# Patient Record
Sex: Male | Born: 1950 | Race: White | Hispanic: No | Marital: Single
Health system: Midwestern US, Academic
[De-identification: ages and names within clinical notes are randomized; demographics above are authoritative.]

---

## 2016-12-05 ENCOUNTER — Encounter: Admit: 2016-12-05 | Discharge: 2016-12-06 | Payer: MEDICARE

## 2016-12-05 DIAGNOSIS — R69 Illness, unspecified: Principal | ICD-10-CM

## 2016-12-18 ENCOUNTER — Encounter: Admit: 2016-12-18 | Discharge: 2016-12-19 | Payer: MEDICARE

## 2016-12-18 DIAGNOSIS — R69 Illness, unspecified: Principal | ICD-10-CM

## 2017-02-20 ENCOUNTER — Encounter: Admit: 2017-02-20 | Discharge: 2017-02-21 | Payer: MEDICARE

## 2017-02-20 DIAGNOSIS — R69 Illness, unspecified: Principal | ICD-10-CM

## 2017-02-20 IMAGING — CT Abdomen^1_ABDOMEN_PELVIS_WITH (Adult)
1 series · 15 of 32 positions shown, 19 images · IV contrast (APPLIED)
Comparison: none

[Series 2: abd/pelvis with 5.0 soft tissue · axial · 0.88mm/px · z∈[-579,-129]mm · 15 of 100 slices shown, 19 images]
[im 7/100  soft-tissue]
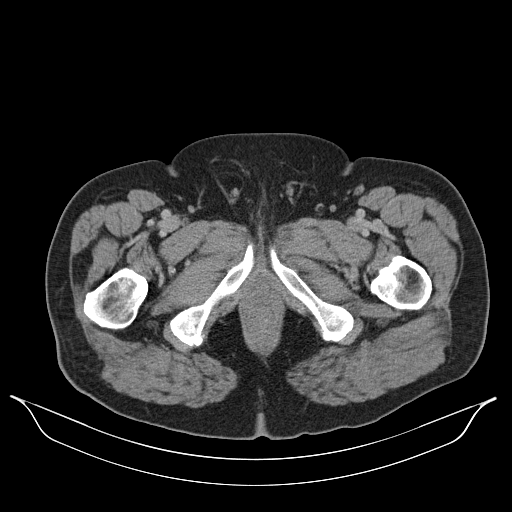
[im 7/100  bone]
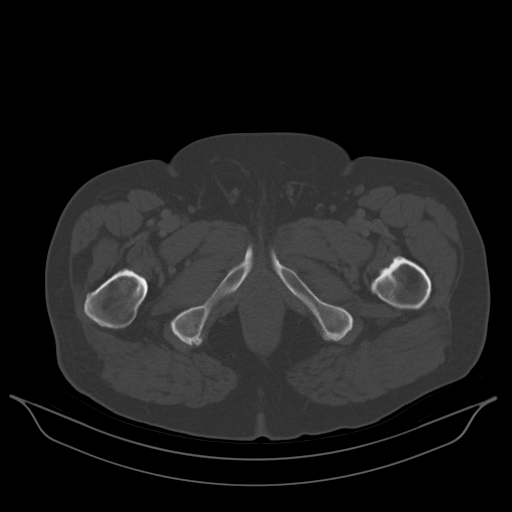
[im 13/100  soft-tissue]
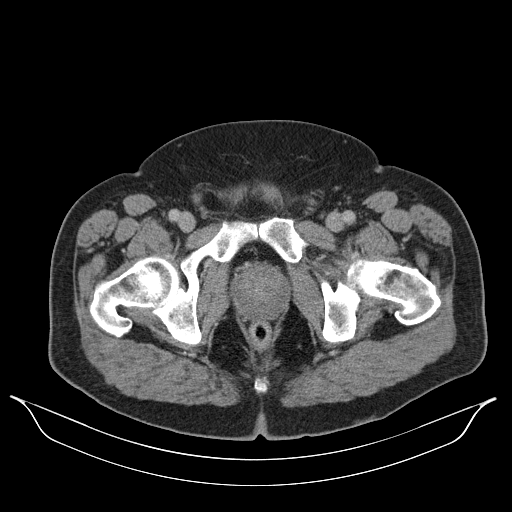
[im 20/100  soft-tissue]
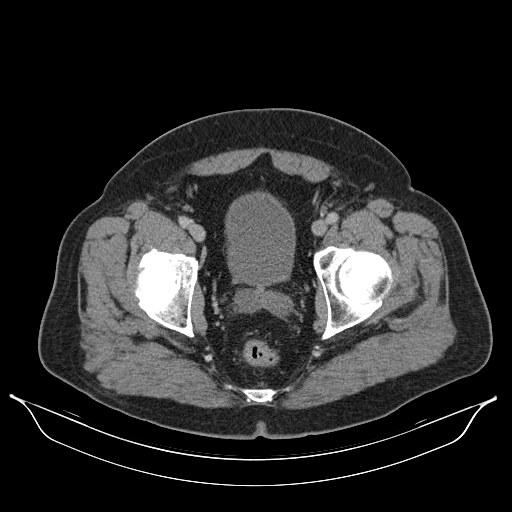
[im 29/100  soft-tissue]
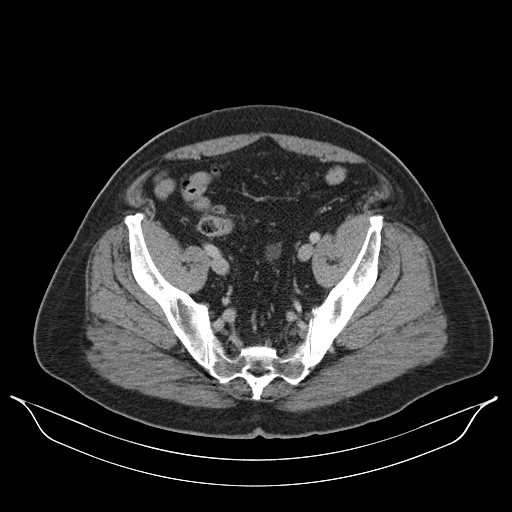
[im 36/100  soft-tissue]
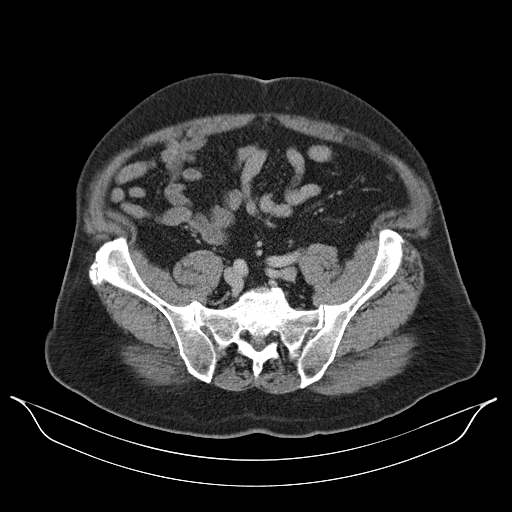
[im 42/100  soft-tissue]
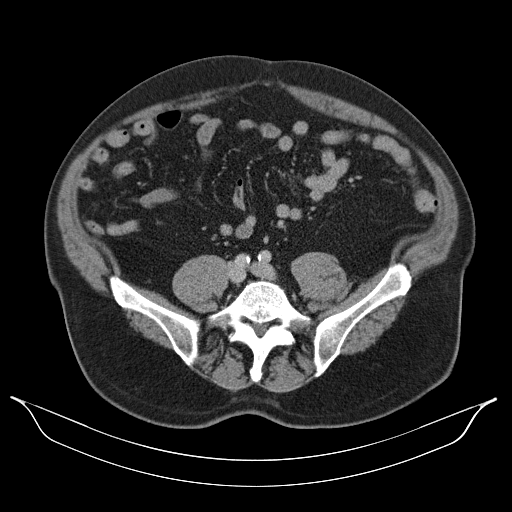
[im 52/100  soft-tissue]
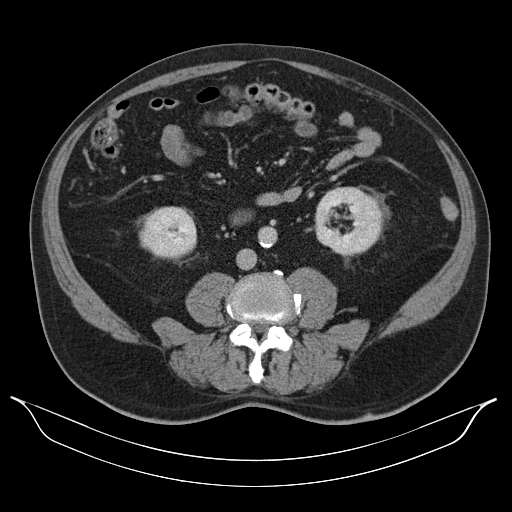
[im 58/100  soft-tissue]
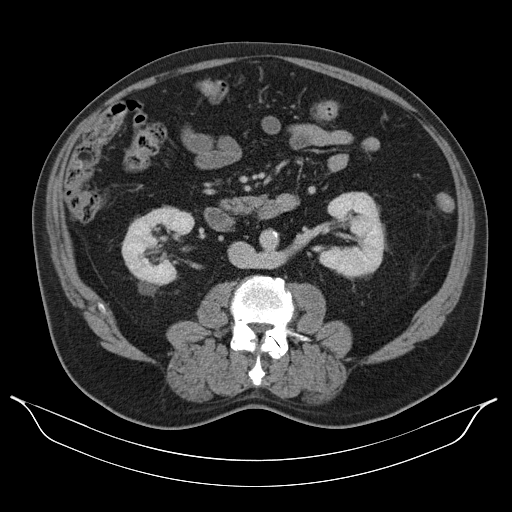
[im 64/100  soft-tissue]
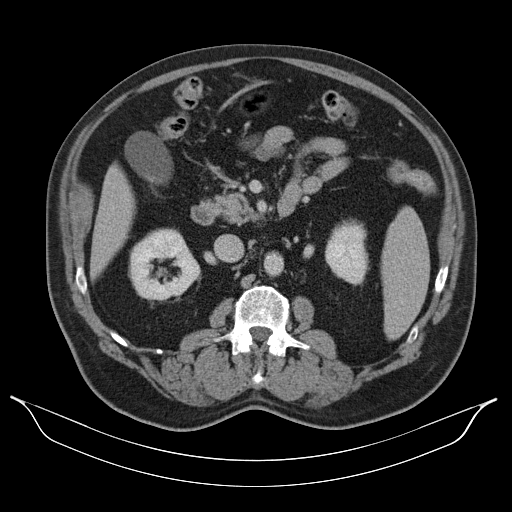
[im 64/100  bone]
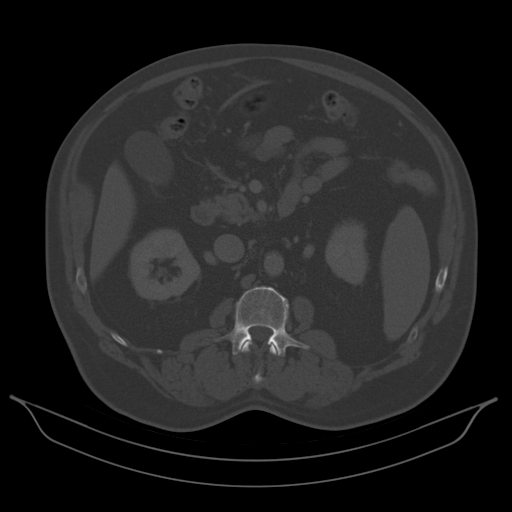
[im 71/100  soft-tissue]
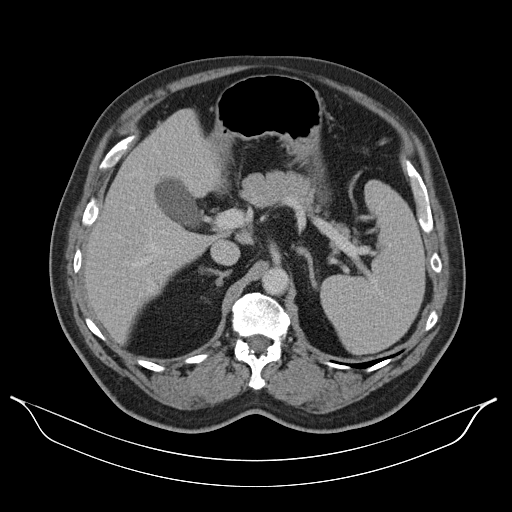
[im 80/100  soft-tissue]
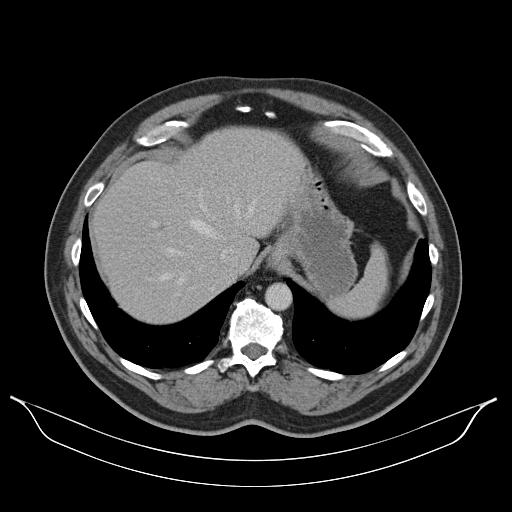
[im 87/100  soft-tissue]
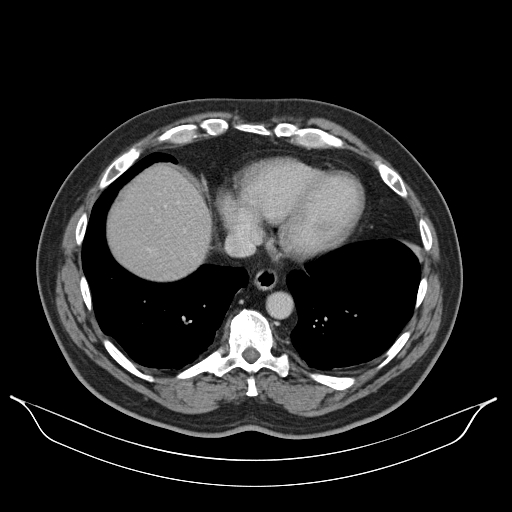
[im 87/100  lung]
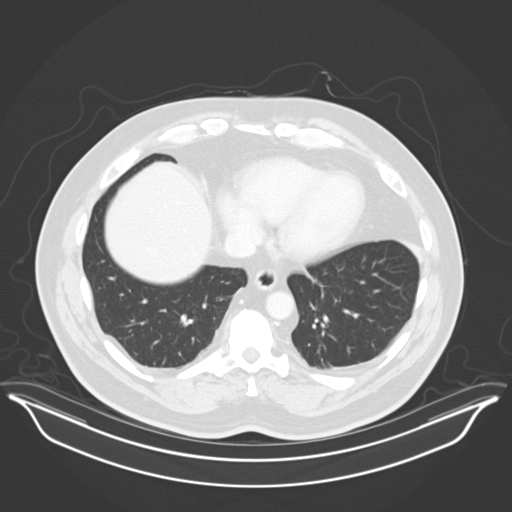
[im 90/100  lung]
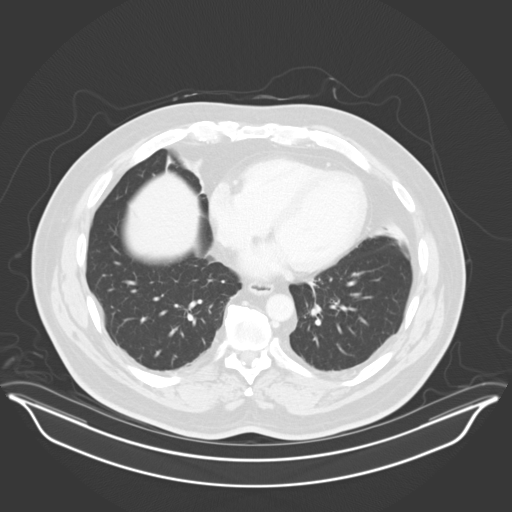
[im 93/100  soft-tissue]
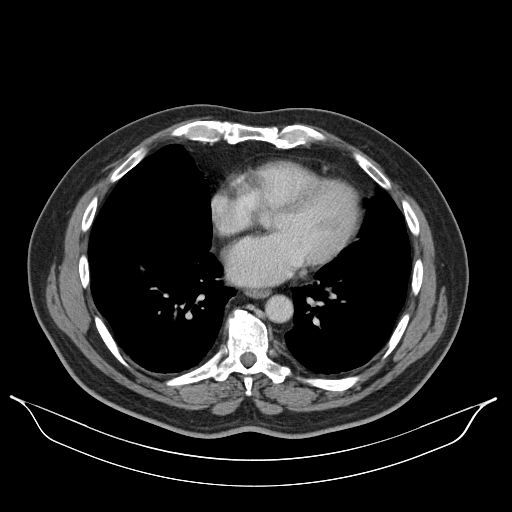
[im 93/100  lung]
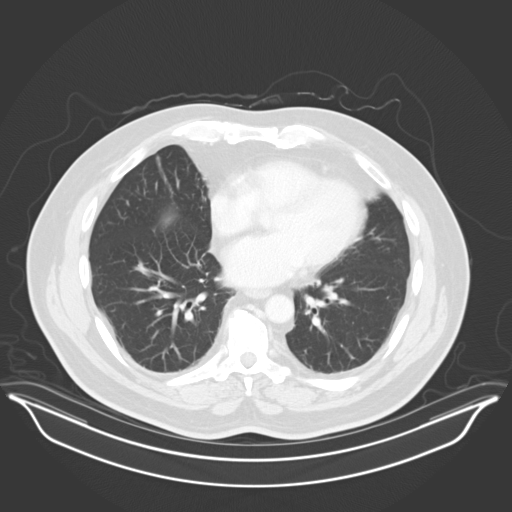
[im 96/100  lung]
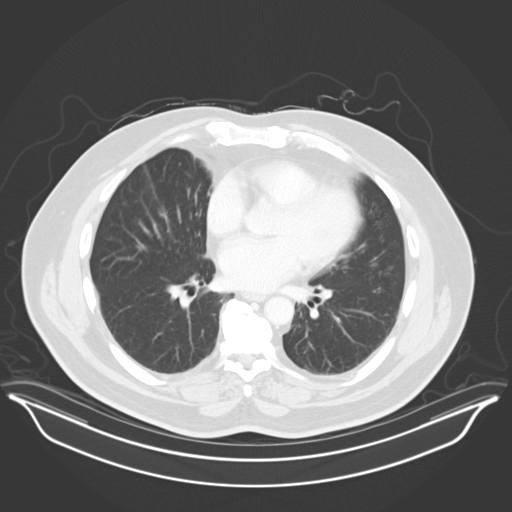

[15 of 32 positions shown; findings below may reference images not displayed]

DIAGNOSTIC STUDIES

EXAM
COMPUTED TOMOGRAPHY, ABDOMEN AND PELVIS; WITH CONTRAST MATERIAL CPT 10511

INDICATION
S/P colonoscopy X 1 d. Acute, diffuse, lower abdo pain.S/P colonoscopy X 1 d. Acute, diffuse,
lower abdo pain. Creat = 0.92 [id number]

TECHNIQUE

All CT scans at this facility use dose modulation, iterative reconstruction, and/or weight based
dosing when appropriate to reduce radiation dose to as low as reasonably achievable.

COMPARISONS
12/18/2016

FINDINGS
[The lung bases are clear. There is mild hepatic steatosis. 9 millimeter low-attenuation lesion
within the right hepatic lobe, probably a hemangioma. The spleen, pancreas, and adrenal glands
appear grossly unremarkable. Small layering gallstones within the gallbladder lumen. No
hydronephrosis. Small cyst within the lower pole of the right kidney. There is no bowel obstruction
or free air. Moderate degree of sigmoid diverticulosis. There is mild thickening of the colon at
the level of the splenic flexure and descending colon. Normal appendix. Small hiatal hernia. Mild
gastric mucosal thickening. No aortic aneurysm. Mild atherosclerotic disease. No pathologically
enlarged retroperitoneal nodes. Tiny umbilical hernia containing. The urinary bladder is
incompletely distended. Moderately enlarged prostate containing punctate calcifications. Moderate
degree of degenerative changes at the level of L4-5. Flowing anterior osteophytes within the lower
thoracic spine.

IMPRESSION
1. Moderate degree of sigmoid diverticulosis. Mild colitis at the level of the splenic flexure and
descending colon.
2. Moderately enlarged prostate.
No bowel obstruction or free air.

## 2017-02-25 ENCOUNTER — Encounter: Admit: 2017-02-25 | Discharge: 2017-02-26 | Payer: MEDICARE

## 2017-02-25 DIAGNOSIS — R69 Illness, unspecified: Principal | ICD-10-CM

## 2017-02-25 IMAGING — CT Thorax^1_CHEST_WITH (Adult)
1 series · 15 of 32 positions shown, 19 images · IV contrast (APPLIED)
Comparison: none

[Series 2: chest with 5.0 soft tissue · axial · 0.81mm/px · z∈[-388,-52]mm · 15 of 75 slices shown, 19 images]
[im 6/75  mediastinal]
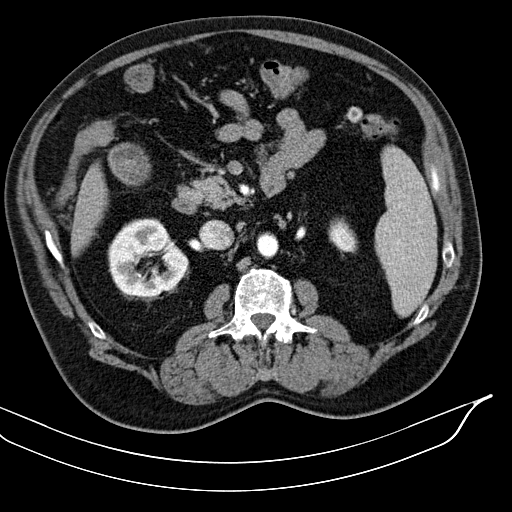
[im 6/75  lung]
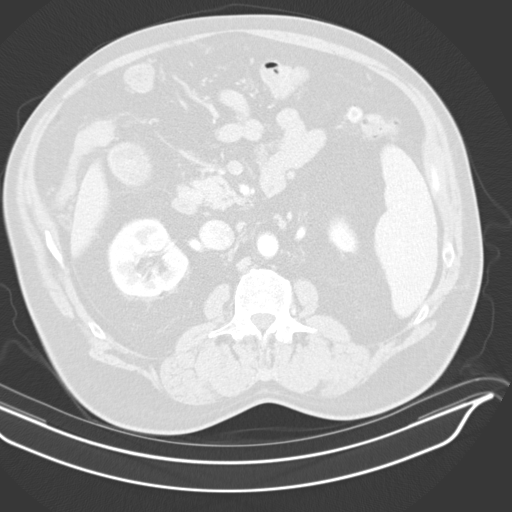
[im 11/75  lung]
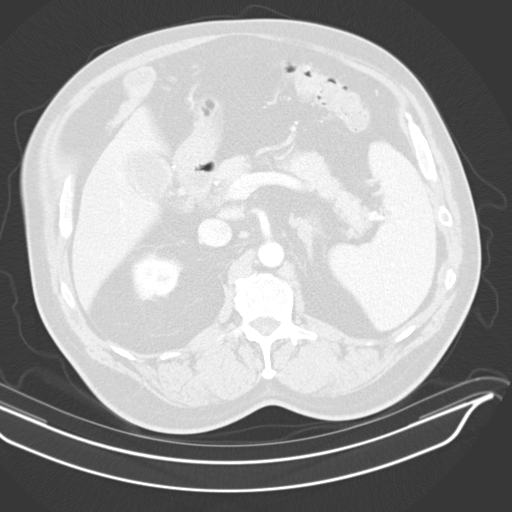
[im 15/75  lung]
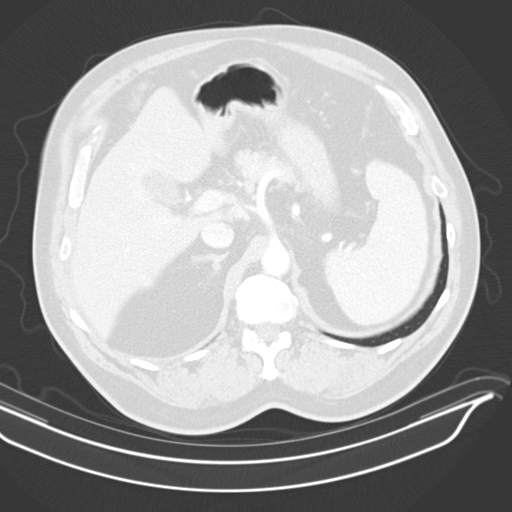
[im 20/75  lung]
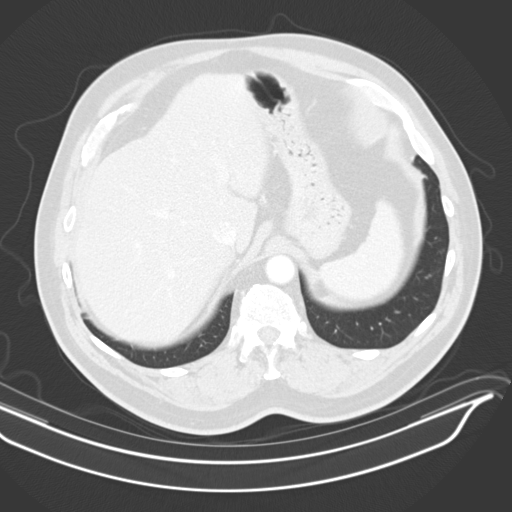
[im 25/75  mediastinal]
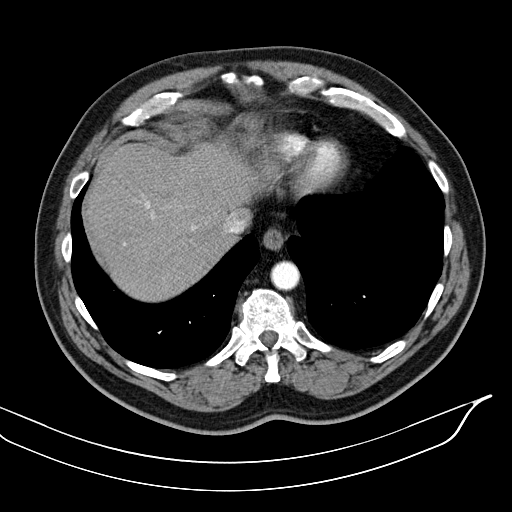
[im 25/75  lung]
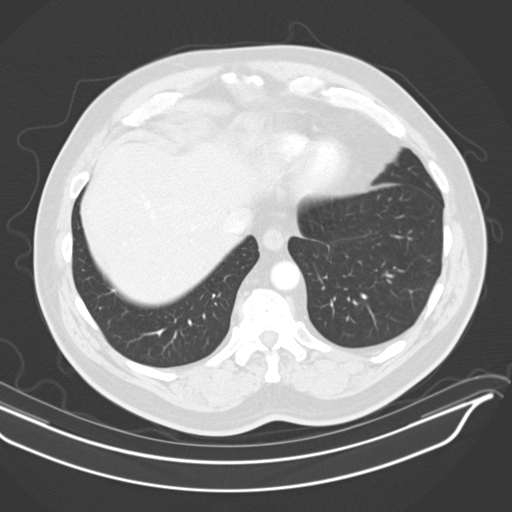
[im 30/75  lung]
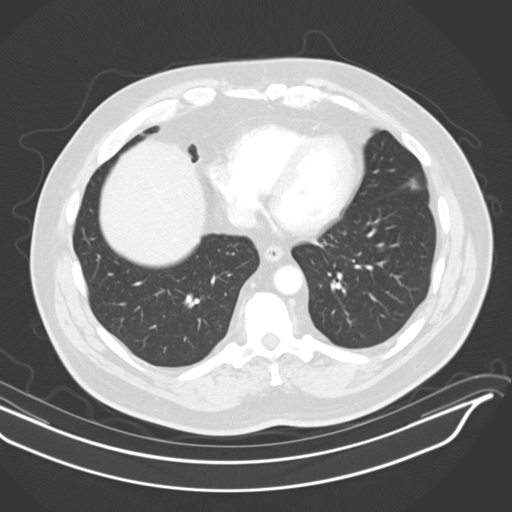
[im 33/75  lung]
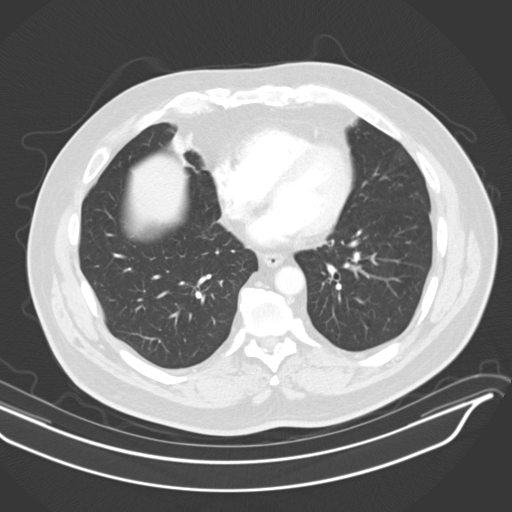
[im 39/75  lung]
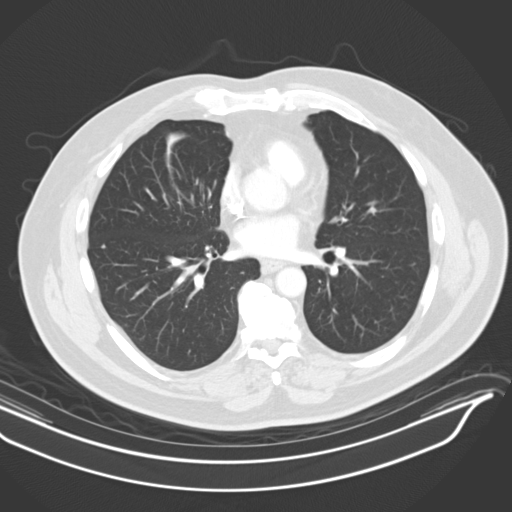
[im 44/75  mediastinal]
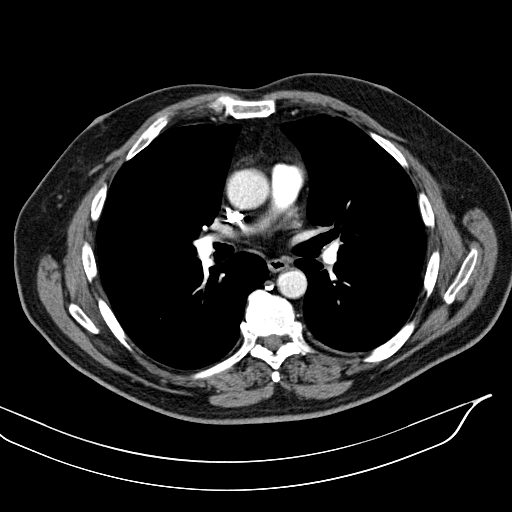
[im 44/75  lung]
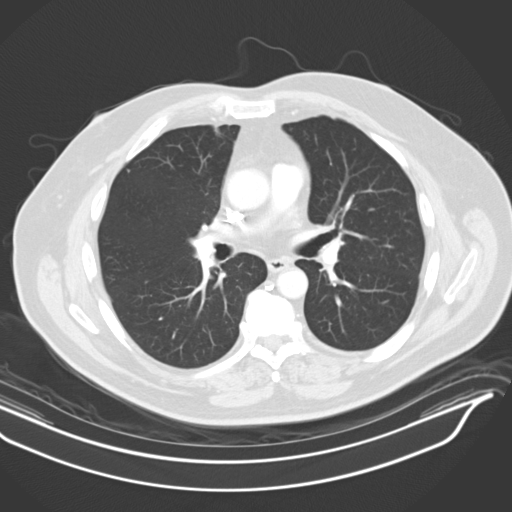
[im 47/75  lung]
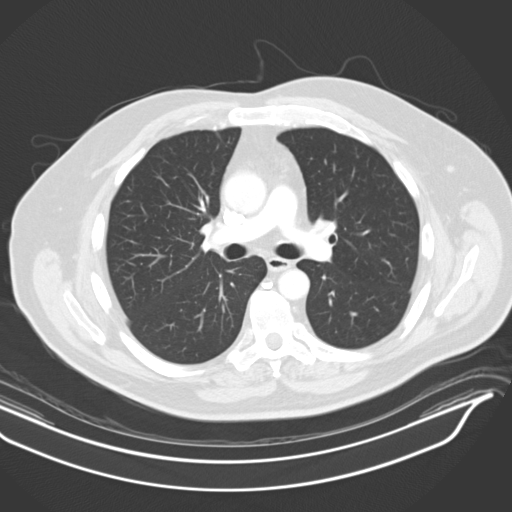
[im 53/75  lung]
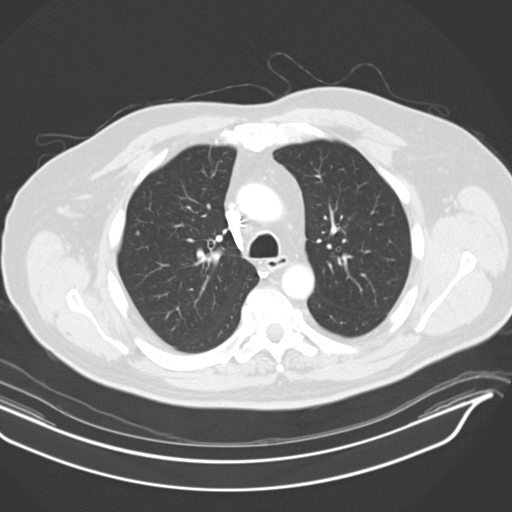
[im 58/75  lung]
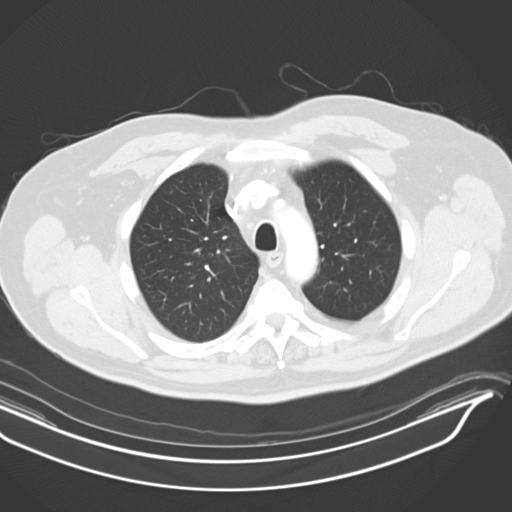
[im 61/75  mediastinal]
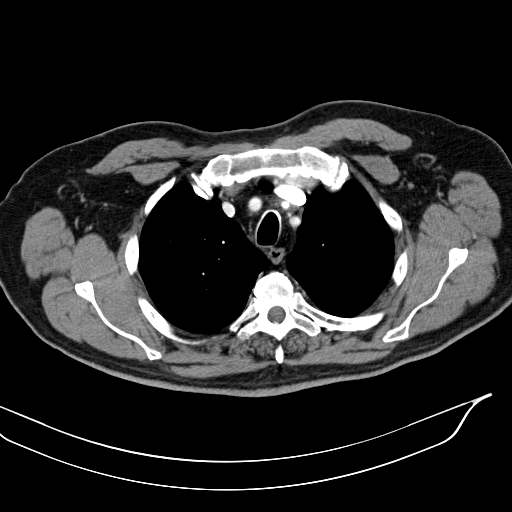
[im 61/75  lung]
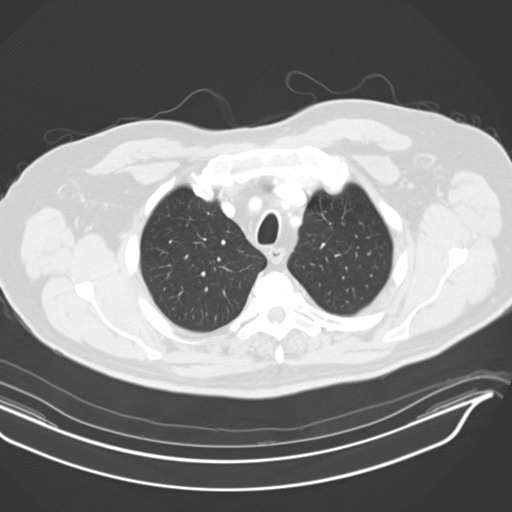
[im 66/75  lung]
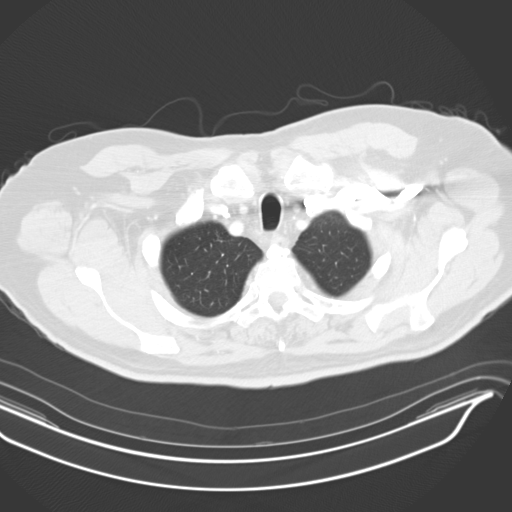
[im 72/75  lung]
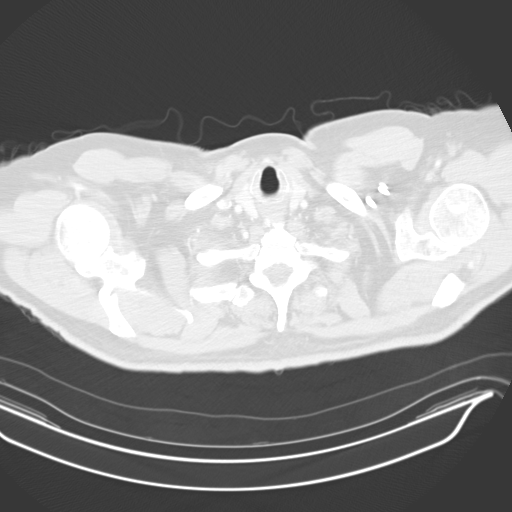

[15 of 32 positions shown; findings below may reference images not displayed]

DIAGNOSTIC STUDIES

EXAM

CT chest with contrast

INDICATION

Colon Cancer
PT STATES RECENTLY DIAGNOSED WITH COLON CANCER. HX OF HTN. GFR 90. XFFAL
[id number]. NKIA. NOT DIABETIC. TJ

TECHNIQUE

Volumetric multi detector CT images of the chest are obtained after the administration of 100 cc
Omnipaque 300 low osmolar intravenous contrast

All CT scans at this facility use dose modulation, iterative reconstruction, and/or weight based
dosing when appropriate to reduce radiation dose to as low as reasonably achievable.

COMPARISONS

None available.

FINDINGS

The thoracic inlet is grossly unremarkable. The thoracic aorta is non aneurysmal. There is no
central filling defect to suggest pulmonary embolus . Otherwise, there is no evidence of mediastinal
, hilar or axillary adenopathy. There is minimal likely atelectasis within the lingula and middle
lobes. There is noncalcified pulmonary nodule within the peripheral middle lobe on series 2, image
37 measuring 2.2 millimeters. There is additional pulmonary nodule seen within the peripheral right
upper lobe on series 2, image 23. This measures 3.3 millimeters.. There is no dominant pulmonary
mass. The thoracic vertebral body heights are grossly maintained with moderate multilevel
degenerative disc disease and diffuse flowing anterior osteophytes without evidence of definite
lytic or blastic lesion. The partially visualized upper abdominal viscera are grossly within normal
limits.

IMPRESSION

1. No evidence of acute cardiopulmonary abnormality.

2. Incidentally noted small noncalcified pulmonary nodules as described above for which further
evaluation with repeat follow-up in 6 months time is recommended.

## 2017-03-03 ENCOUNTER — Encounter: Admit: 2017-03-03 | Discharge: 2017-03-03 | Payer: MEDICARE

## 2017-03-03 DIAGNOSIS — C19 Malignant neoplasm of rectosigmoid junction: Principal | ICD-10-CM

## 2017-03-04 ENCOUNTER — Encounter: Admit: 2017-03-04 | Discharge: 2017-03-04 | Payer: MEDICARE

## 2017-03-05 ENCOUNTER — Encounter: Admit: 2017-03-05 | Discharge: 2017-03-05 | Payer: MEDICARE

## 2017-03-05 DIAGNOSIS — R69 Illness, unspecified: Principal | ICD-10-CM

## 2017-03-09 ENCOUNTER — Encounter: Admit: 2017-03-09 | Discharge: 2017-03-09 | Payer: MEDICARE

## 2017-03-09 NOTE — Telephone Encounter
Navigation Intake Assessment Document    Patient Name:  Dan Steele  DOB:  1950-10-20  Insurance: Medicare    Appointment Info:   Future Appointments  Date Time Provider Department Center   03/12/2017 12:30 PM Benetta Spar, MD CCC2 [place] Exam   03/12/2017 1:00 PM Dimas Millin [place] Exam   03/12/2017 3:00 PM PAC ROOM 2 PRE None   03/12/2017 4:30 PM MRI - HOSPITAL ROOM 1 (3T) MR Radiology     Diagnosis & Reason for Visit: Newly dx rectal cancer    Physician Info:   ??? Referring Physician:  Dr. Wilford Grist, [facility]  ??? Contact Name & Number: Nelly Laurence phone: [phone], fax [phone]  ??? PCP: Dr. Wilford Grist    Location of Films:  PACS    Location of Pathology:  Patient notified outside pathology slides will be obtained for review by [facility] pathologist and a facility and professional fee will be billed to their insurance. Requested: specimen number: [id number] collected on 02/19/17, read by [facility].    History of Present Illness: Patient reports that he presented for his first screening colonoscopy on 02/19/17. The colonoscopy wa completed by his PCP.    12/05/16 - CT A/P     12/18/16 - CT A/P - Liver lesion follow up.     02/19/17 - Colonoscopy. a 1.5 cm adenocarcinoma noted at 15 cm from the anal verge. Pathology, colonic mucosa, proximal rectal sigmoid polypectomy = invasive moderately differentiated adenocarcinoma. MSI stable. BRAF mutation not detected.     02/20/17 - CT A/P    02/25/17 - CT Chest - Small noncalcified pulmonary nodules. See full report.     02/25/17 - CEA = 1.4    03/12/17 - Consult with Dr. Daphine Deutscher planned, consult with dietitian, PAT scheduled as needed and 3T MRI pelvis also planned.     NEEDS Assessment:    Genetic Counseling:  Assessment:  Genetic Assessment: Other (Comment) (Father died of bone ca, mother has hx of breast ca. )      Intervention:  Genetic Intervention: Other (Comment) (Documented)     Nutrition: Assessment:  Current Weight (in pounds): 232  Recent Weight Loss Without Trying?: No  Eating Poorly Due to Decreased Appetite?: No  Score: Malnutrition Screening Tool (MST): 0  Additional Nutrition Assessment: Other (Comment) (Scheduled with RD, seeing surgeon. )    Intervention:  Nutrition Intervention: Referral placed to Dietician    Social & Financial:  Assessment:  Social and Financial Assessment: Reports adequate support system    Intervention:  Social and Financial Intervention: Other (Comment) (n/a)       Spiritual & Emotional:  Assessment:   Spiritual and Emotional Assessment: Reports adequate support system    Intervention:  Spiritual and Emotional Intervention: Other (Comment) (n/a)    Physical:  Assessment:  Fall Risk: None identified    Intervention:       Communication:  Assessment:  Communication Barrier: No    Intervention:  Communication intervention: Other (Comment) (n/a)    Onc Fertility:   Assessment:  Onc Fertility Assessment: Not applicable    Intervention:  Onc Fertility Intervention: Other (Comment) (n/a)    Additional Education:    Additional Education Documented: Yes

## 2017-03-12 ENCOUNTER — Encounter: Admit: 2017-03-12 | Discharge: 2017-03-12 | Payer: MEDICARE

## 2017-03-12 ENCOUNTER — Inpatient Hospital Stay: Admit: 2017-03-12 | Discharge: 2017-03-12 | Payer: MEDICARE

## 2017-03-12 ENCOUNTER — Ambulatory Visit: Admit: 2017-03-12 | Discharge: 2017-03-12 | Payer: MEDICARE

## 2017-03-12 ENCOUNTER — Ambulatory Visit: Admit: 2017-03-12 | Discharge: 2017-03-13 | Payer: MEDICARE

## 2017-03-12 DIAGNOSIS — I10 Essential (primary) hypertension: Principal | ICD-10-CM

## 2017-03-12 DIAGNOSIS — C20 Malignant neoplasm of rectum: Principal | ICD-10-CM

## 2017-03-12 DIAGNOSIS — C19 Malignant neoplasm of rectosigmoid junction: ICD-10-CM

## 2017-03-12 DIAGNOSIS — K219 Gastro-esophageal reflux disease without esophagitis: ICD-10-CM

## 2017-03-12 DIAGNOSIS — Z7982 Long term (current) use of aspirin: ICD-10-CM

## 2017-03-12 DIAGNOSIS — C801 Malignant (primary) neoplasm, unspecified: ICD-10-CM

## 2017-03-12 LAB — COMPREHENSIVE METABOLIC PANEL
Lab: 0.7 mg/dL (ref 0.4–1.24)
Lab: 1.6 mg/dL — ABNORMAL HIGH (ref 0.3–1.2)
Lab: 10 (ref 3–12)
Lab: 105 MMOL/L (ref 98–110)
Lab: 105 mg/dL — ABNORMAL HIGH (ref 70–100)
Lab: 139 MMOL/L (ref 137–147)
Lab: 17 U/L (ref 7–56)
Lab: 18 U/L (ref 7–40)
Lab: 24 MMOL/L (ref 21–30)
Lab: 3.7 MMOL/L (ref 3.5–5.1)
Lab: 4.6 g/dL (ref 3.5–5.0)
Lab: 52 U/L (ref 25–110)
Lab: 7.5 g/dL (ref 6.0–8.0)
Lab: 9 mg/dL (ref 7–25)
Lab: 9.7 mg/dL (ref 8.5–10.6)
Lab: >60 mL/min (ref >60)
Lab: >60 mL/min (ref >60)

## 2017-03-12 LAB — CBC: Lab: 8.1 10*3/uL (ref 4.5–11.0)

## 2017-03-12 NOTE — Progress Notes
Pt left prior to RD appointment, I remain available PRN.    Caprice Red, MS, RD, LD   Phone: [phone]  Pager: [phone]

## 2017-03-12 NOTE — Pre-Anesthesia Patient Instructions
GENERAL INFORMATION    Before you come to the hospital  ??? Make arrangements for a responsible adult to drive you home and stay with you for 24 hours following surgery.  ??? Bath/Shower Instructions  ??? Take a bath or shower using the special soap given to you in PAC. Use half the bottle the night before, and the other half the morning of your procedure. Use clean towels with each bath or shower.  ??? Put on clean clothes after bath or shower.  Avoid using lotion and oils.  ??? If you are having surgery above the waist, wear a shirt that fastens up the front.  ??? Sleep on clean sheets if bath or shower is done the night before procedure.  ??? Leave money, credit cards, jewelry, and any other valuables at home. [facility] is not responsible for the loss or breakage of personal items.  ??? Remove nail polish, makeup and all jewelry (including piercings) before coming to the hospital.  ??? The morning of your procedure:  ??? brush your teeth and tongue  ??? do not shave the area where you will have surgery    What to bring to the hospital  ??? ID/ Insurance Card  ??? Small bag with a few personal belongings  ??? Cases for glasses/hearing aids/contact lens (bring solutions for contacts)  ??? Dress in clean, loose, comfortable clothing     Eating or drinking before surgery  ??? Do not eat or drink anything after 11:00 p.m. the day before your procedure (including gum, mints, candy, or chewing tobacco) OR follow the specific instructions you were given by your Surgeon.  ??? You may have WATER ONLY up to 2 hours before arriving at the hospital.   ???   Other instructions  Notify your surgeon if:  ??? you become ill with a cough, fever, sore throat, nausea, vomiting or flu-like symptoms  ??? you have any open wounds/sores that are red, painful, draining, or are new since you last saw  the doctor  ??? you need to cancel your procedure  ??? You will receive a call with your surgery arrival time from between 2:30pm and 4:30pm the last business day before your procedure.  If you do not receive a call, please call [phone] before 4:30pm or [phone] after 4:30pm.    Notify us at [facility]: [phone]  ??? if you need to cancel your procedure  ??? if you are going to be late    Arrival at the hospital  [facility]  [address]  [place], [place] [address]    ? Park in the P5 parking garage located at [employer], [place], [place] [address].   ? Judee Clara parking is available in front of [employer] [facility] between the hours of 7:00 am and 4:00 pm Monday through Friday.  ? If parking in the P5 garage, take the east elevators in the parking garage to the second level and walk to the entrance of the [employer] [facility].    ? Enter through the 1st floor main entrance and check in with Information Desk.

## 2017-03-12 NOTE — Progress Notes
Name: Dan Steele          MRN: [id number]      DOB: Mar 12, 1951      AGE: 66 y.o.   DATE OF SERVICE: 03/12/2017    Subjective:             Reason for Visit:  Rectal cancer    History of Present Illness  Duilio is a 66 year old pleasant gentleman who presents to the clinic today to discuss surgical management of his newly diagnosed rectal cancer.  His past medical history is only significant for hypertension and GERD.  He underwent his first screening colonoscopy on 02/19/2017.  He reports no change in stool habits, unintentional weight loss, bloating, nausea vomiting, or blood in stool.  During the colonoscopy a lesion was identified 15 cm from the anal verge which was removed using snare polypectomy.  Pathology from that lesion revealed invasive moderately differentiated adenocarcinoma extending into the submucosa into the cauterized edge, arising out of a tubular adenoma.  He had 2 other tubular adenomas removed as well as 2 hyperplastic polyps.  The cancer specimen was negative for BRAF and was MSI negative.  He then underwent staging CT chest, abdomen, pelvis with contrast which showed no obvious metastatic lesions.  His CEA level was within normal limits at 1.4.      Even with this new diagnosis of rectal cancer he continues to report no change in his bowel habits, unintentional weight loss, bloating, nausea, vomiting, or blood in stool.  He denies any family history of colon or rectal cancer as well as irritable bowel diseases.    Review of outside records:  **Procedures:  02/19/2017 ??? Colonoscopy:   Basics/Findings (abbreviated findings): Cecum was identified with photodocumentation of the ileocecal valve.  The bowel preparation was 7/9 on the [facility] bowel prep scale.  The patient had a large 1.5 cm pedunculated polyp with central location as well as 0.8 cm immediately adjacent pedunculated polyp 15 cm from the anal verge adjacent polyp was removed using snare polypectomy with cautery.  The middle polyp was partially removed with snare polypectomy with cautery.  Posterior third was transected and removed with biopsy forceps in piecemeal fashion with at least 6-7 bites.  Patient also had 1 cm pedunculated polyp in the mid sigmoid that was removed using snare polypectomy with cautery.  The stomach was not completely removed and second snare pass was used to completely remove the stalk.  The patient also had 2 sessile 2 x 2 mm polyps in the distal sigmoid that were completely removed using biopsy forceps.    Pathology:  A. Colonic mucosa, distal sigmoid polypectomy x2: Tubular adenomas  B. Colonic mucosa, proximal rectal sigmoid polypectomy: Invasive moderately differentiated adenocarcinoma extending into the submucosa and to a cauterized edge, arising out of a tubular adenoma.  C. Colonic mucosa, distal sigmoid sessile polypectomy: Hyperplastic polyp  D. Colonic mucosa, distal sigmoid sessile polypectomy: Hyperplasic polyp   Comment: BRAF and MSI negative    **Imaging:  12/05/16 - CT Abd/pelv for nausea/vomiting/RUQ pain:  1.7cm hypodense lesion in lower right posterior segment of liver, hemangioma vs cyst, 1.1cm exophytic hypodense non-enhancing lesion in mid third of right kidney  02/20/2017 ??? CT Abd/pelv:  Moderate degree of sigmoid diverticulosis. Mild colitis at the level of the splenic flexure and descending colon. 2. Moderately enlarged prostate. No bowel obstruction or free air.  02/25/2017 ??? CT Chest:  No evidence of acute cardiopulmonary abnormality. 2. Incidentally small (<89mm) noncalcified pulmonary nodules for which  further evaluation with repeat follow up in is recommended.     **Labs:  02/25/2017 ??? CEA ??? 1.4    Past Medical History:   Diagnosis Date   ??? Acid reflux     tums prn    ??? Adenocarcinoma (HCC)     of rectum    ??? Hypertension      Past Surgical History:   Procedure Laterality Date   ??? COLONOSCOPY       Family History   Problem Relation Age of Onset   ??? Cancer-Breast Mother ??? Diabetes Mother    ??? Cancer Father    ??? Diabetes Father      Social History     Social History   ??? Marital status: Single     Spouse name: N/A   ??? Number of children: N/A   ??? Years of education: N/A     Social History Main Topics   ??? Smoking status: Former Smoker     Packs/day: 0.50     Years: 10.00     Types: Cigarettes     Quit date: 1988   ??? Smokeless tobacco: Never Used   ??? Alcohol use 8.4 oz/week     14 Cans of beer per week   ??? Drug use: No   ??? Sexual activity: Not on file     Other Topics Concern   ??? Not on file     Social History Narrative   ??? No narrative on file        Review of Systems   Constitutional: Negative for activity change, chills, fatigue, fever and unexpected weight change.   HENT: Negative for congestion, facial swelling, trouble swallowing and voice change.    Eyes: Negative for discharge, redness and itching.   Respiratory: Negative for choking, chest tightness and shortness of breath.    Cardiovascular: Negative for chest pain, palpitations and leg swelling.   Gastrointestinal: Negative for abdominal distention, abdominal pain, anal bleeding, blood in stool, constipation, diarrhea, nausea, rectal pain and vomiting.   Endocrine: Negative for cold intolerance and heat intolerance.   Genitourinary: Negative for difficulty urinating, dysuria, frequency and hematuria.   Musculoskeletal: Negative for arthralgias, gait problem and joint swelling.   Skin: Negative for color change.   Allergic/Immunologic: Negative for immunocompromised state.   Neurological: Negative for dizziness, weakness and headaches.   Psychiatric/Behavioral: Negative for agitation, behavioral problems and confusion. The patient is not nervous/anxious.    All other systems reviewed and are negative.    Objective:         ??? aspirin 81 mg chewable tablet 81 mg.   ??? hydroCHLOROthiazide (HYDRODIURIL) 12.5 mg capsule TAKE 1 CAPSULE BY MOUTH DAILY ON **MON,WED,FRI ??? HYDROcodone/acetaminophen (NORCO) 5/325 mg tablet TAKE 1 TABLET BY MOUTH EVERY 4 TO 6 HOURS AS NEEDED   ??? lisinopril (PRINIVIL, ZESTRIL) 40 mg tablet Take 40 mg by mouth daily.     Vitals:    03/12/17 1221 03/12/17 1222   BP: (!) 148/103    Pulse: 114    Resp: 18    Temp: 37.2 ???C (99 ???F)    TempSrc: Oral Oral   SpO2: 99%    Weight: 101.9 kg (224 lb 9.6 oz)    Height: 181.6 cm (71.5)      Body mass index is 30.89 kg/m???.     Pain Score: Zero     Pain Addressed:  N/A    Patient Evaluated for a Clinical Trial: No treatment clinical trial available for  this patient.     [place] Cooperative Oncology Group performance status is 0, Fully active, able to carry on all pre-disease performance without restriction.Marland Kitchen     Physical Exam   Constitutional: He is oriented to person, place, and time. He appears well-developed and well-nourished. No distress.   HENT:   Head: Normocephalic and atraumatic.   Right Ear: External ear normal.   Left Ear: External ear normal.   Nose: Nose normal.   Mouth/Throat: Oropharynx is clear and moist.   Eyes: Conjunctivae, EOM and lids are normal.   Neck: Normal range of motion.   Cardiovascular: Normal rate, regular rhythm, S1 normal, S2 normal, normal heart sounds and normal pulses.  Exam reveals no gallop.    No murmur heard.  No peripheral edema.   Pulmonary/Chest: Effort normal and breath sounds normal. No respiratory distress.   Abdominal: Soft. Bowel sounds are normal. He exhibits no distension. There is no tenderness. There is no rebound and no guarding. No hernia.   Genitourinary:   Genitourinary Comments: External - no significant external hemorrhoids or masses  DRE - no masses, no blood, good tone, slightly enlarged but smooth prostate  Rigid proctoscopy - ulcerated mass at 13cm from the anal verge on the right side   Musculoskeletal: Normal range of motion. He exhibits no edema or tenderness.   Lymphadenopathy:        Right: No supraclavicular adenopathy present. Left: No supraclavicular adenopathy present.   Neurological: He is alert and oriented to person, place, and time.   Skin: Skin is warm and dry.   Psychiatric: He has a normal mood and affect. His speech is normal and behavior is normal. Judgment and thought content normal. Cognition and memory are normal.   Vitals reviewed.    Assessment and Plan:  66 year old male with newly diagnosed MSS rectal adenocarcinoma 13cm from the anal verge without symptoms or metastatic spread.  - Outside slides ordered for review here at [facility]  - Pelvic MRI with rectal cancer protocol pending for local staging  - PAT appointment pending  - We discussed the possible treatment options today including neoadjuvant chemoradiation vs MIS low anterior resection, possible ostomy, depending upon the MRI results.  The risks and benefits were discuss in detail including risks of anastomotic leak, damage to surrounding structures (ureter, nerves, small bowel, prostate, bladder), bleeding, and infection.    - Will discuss at GI tumor board  - Will call patient after tumor board discussion    Deedra Ehrich, MD  Colon and Rectal Surgery  Pager: [phone]

## 2017-03-13 ENCOUNTER — Encounter: Admit: 2017-03-13 | Discharge: 2017-03-13 | Payer: MEDICARE

## 2017-03-13 DIAGNOSIS — K219 Gastro-esophageal reflux disease without esophagitis: ICD-10-CM

## 2017-03-13 DIAGNOSIS — C801 Malignant (primary) neoplasm, unspecified: ICD-10-CM

## 2017-03-13 DIAGNOSIS — I10 Essential (primary) hypertension: Principal | ICD-10-CM

## 2017-03-17 ENCOUNTER — Encounter: Admit: 2017-03-17 | Discharge: 2017-03-17 | Payer: MEDICARE

## 2017-03-17 NOTE — Progress Notes
[facility]  CANCER CONFERENCE    Tumor Conference Date: 03/17/17  Patient:             Dan Steele  Med Rec #:  [id number]  DOB:              [birth year]    Presenting Physician: Dr. Eulah Pont  Medical Oncologist: n/a  Surgeon: Dr. Eulah Pont  Radiation Oncologist: n/a    Attendance: Medical Oncology, Radiation Oncology, Surgical Oncology, Radiology, Pathology    Discussion:  Prospective discussion    Reason for presentation: discuss tx options and examine films    Clinical History/Significant PMHx:     History of Present Illness  Mr. Dan Steele is a 66 year old pleasant gentleman who presents to the clinic today to discuss surgical management of his newly diagnosed rectal cancer.  His past medical history is only significant for hypertension and GERD.  He underwent his first screening colonoscopy on 02/19/2017.  He reports no change in stool habits, unintentional weight loss, bloating, nausea vomiting, or blood in stool.  During the colonoscopy a lesion was identified 15 cm from the anal verge which was removed using snare polypectomy pathology from that lesion revealed invasive moderately differentiated adenocarcinoma extending into the submucosa into the cauterized edge, arising out of a tubular adenoma.  He had 2 other tubular adenomas removed as well as 2 hyperplastic polyps.  The cancer specimen was negative for BRAF and was MSI negative.  He then underwent staging CT chest, abdomen, pelvis with contrast which showed no obvious metastatic lesions.  His CEA level was within normal limits at 1.4.    ???  Even with this new diagnosis of rectal cancer he continues to report no change in his bowel habits, unintentional weight loss, bloating, nausea, vomiting, or blood in stool.  He denies any family history of colon or rectal cancer as well as irritable bowel diseases.  ???  Review of outside records:  **Procedures:  02/19/2017 ??? Colonoscopy: Basics/Findings (abbreviated findings): Cecum was identified with photodocumentation of the ileocecal valve.  The bowel preparation was 7/9 on the [facility] bowel prep scale.  The patient had a large 1.5 cm pedunculated polyp with central location as well as 0.8 cm immediately adjacent pedunculated polyp 15 cm from the anal verge adjacent polyp was removed using snare polypectomy with cautery.  The middle polyp was partially removed with snare polypectomy with cautery.  Posterior third was transected and removed with biopsy forceps in piecemeal fashion with at least 6-7 bites.  Patient also had 1 cm pedunculated polyp in the mid sigmoid that was removed using snare polypectomy with cautery.  The stomach was not completely removed and second snare pass was used to completely remove the stalk.  The patient also had 2 sessile 2 x 2 mm polyps in the distal sigmoid that were completely removed using biopsy forceps.  ???  Pathology:  A. Colonic mucosa, distal sigmoid polypectomy x2: Tubular adenomas  B. Colonic mucosa, proximal rectal sigmoid polypectomy: Invasive moderately differentiated adenocarcinoma extending into the submucosa and to a cauterized edge, arising out of a tubular adenoma.  C. Colonic mucosa, distal sigmoid sessile polypectomy: Hyperplastic polyp  D. Colonic mucosa, distal sigmoid sessile polypectomy: Hyperplasic polyp   Comment: BRAF and MSI negative  ???  **Imaging:  02/20/2017 ??? CT Abd/pelv:  Moderate degree of sigmoid diverticulosis. Mild colitis at the level of the splenic flexure and descending colon. 2. Moderately enlarged prostate. No bowel obstruction or free air.  02/25/2017 ??? CT Chest:  No evidence of acute cardiopulmonary abnormality. 2. Incidentally small noncalcified pulmonary nodules as described above for which further evaluation with repeat follow up in is recommended.   ???  **Labs:  02/25/2017 ??? CEA ??? 1.4  ???       Past Medical History:   Diagnosis Date   ??? Acid reflux ???   ??? tums prn ??? Adenocarcinoma (HCC) ???   ??? of rectum    ??? Hypertension ???   ???        Past Surgical History:   Procedure Laterality Date   ??? COLONOSCOPY ??? ???   ???        Family History   Problem Relation Age of Onset   ??? Cancer-Breast Mother ???   ??? Diabetes Mother ???   ??? Cancer Father ???   ??? Diabetes Father ???   ???           Review of Systems   Constitutional: Negative for activity change, chills, fatigue, fever and unexpected weight change.   HENT: Negative for congestion, facial swelling, trouble swallowing and voice change.    Eyes: Negative for discharge, redness and itching.   Respiratory: Negative for choking, chest tightness and shortness of breath.    Cardiovascular: Negative for chest pain, palpitations and leg swelling.   Gastrointestinal: Negative for abdominal distention, abdominal pain, anal bleeding, blood in stool, constipation, diarrhea, nausea, rectal pain and vomiting.   Endocrine: Negative for cold intolerance and heat intolerance.   Genitourinary: Negative for difficulty urinating, dysuria, frequency and hematuria.   Musculoskeletal: Negative for arthralgias, gait problem and joint swelling.   Skin: Negative for color change.   Allergic/Immunologic: Negative for immunocompromised state.   Neurological: Negative for dizziness, weakness and headaches.   Psychiatric/Behavioral: Negative for agitation, behavioral problems and confusion. The patient is not nervous/anxious.    All other systems reviewed and are negative.  ???    ???  Patient Evaluated for a Clinical Trial: No treatment clinical trial available for this patient.   ???  [place] Cooperative Oncology Group performance status is 0, Fully active, able to carry on all pre-disease performance without restriction.Marland Kitchen  ???   Physical Exam   Constitutional: He is oriented to person, place, and time. He appears well-developed and well-nourished. No distress.   HENT:   Head: Normocephalic and atraumatic.   Right Ear: External ear normal.   Left Ear: External ear normal. Nose: Nose normal.   Mouth/Throat: Oropharynx is clear and moist.   Eyes: Conjunctivae, EOM and lids are normal.   Neck: Normal range of motion.   Cardiovascular: Normal rate, regular rhythm, S1 normal, S2 normal, normal heart sounds and normal pulses.  Exam reveals no gallop.    No murmur heard.  No peripheral edema.   Pulmonary/Chest: Effort normal and breath sounds normal. No respiratory distress.   Abdominal: Soft. Bowel sounds are normal. He exhibits no distension. There is no tenderness. There is no rebound and no guarding. No hernia.   Musculoskeletal: Normal range of motion. He exhibits no edema or tenderness.   Lymphadenopathy:        Right: No supraclavicular adenopathy present.        Left: No supraclavicular adenopathy present.   Neurological: He is alert and oriented to person, place, and time.   Skin: Skin is warm and dry.   Psychiatric: He has a normal mood and affect. His speech is normal and behavior is normal. Judgment and thought content normal.  Cognition and memory are normal.   Vitals reviewed.    Assessment and Plan:  66yo male with newly diagnosed rectal adenocarcinoma 15cm from the anal verge without symptoms.  ???  -Rigid proctoscopy performed in clinic today  -Outside slides ordered for review here at [facility]  -MRI pelvis pending  -PAT appointment pending  -Will discuss at tumor board  -Will call patient after tumor board discussion  ???  Results of diagnostic procedures/radiological procedures (CT, MRI, PET, FNA):     03/12/17 - MRI Pelvis  IMPRESSION    1. ???Short segment right lateral semiannular mass in the mid to high rectum   without invasion into the mesorectal fat, consistent with the patient's   primary tumor. T2 disease.    2. ???No significant pelvic lymphadenopathy.    3. ???Mild thickening and edema of the lower rectum, which may represent   mild infectious or inflammatory proctitis.    Staging: Cancer Staging  No matching staging information was found for the patient. RECOMMENDATIONS:    MRI reviewed, staged T2N0    NCCN or other national guidelines discussed:  Yes    Candidate for open clinical trial discussed:  N/A    Staging discussed:  Yes

## 2017-03-24 ENCOUNTER — Encounter: Admit: 2017-03-24 | Discharge: 2017-03-24 | Payer: MEDICARE

## 2017-03-24 MED ORDER — ONDANSETRON 8 MG PO TBDI
8 mg | ORAL_TABLET | ORAL | 0 refills | Status: SS | PRN
Start: 2017-03-24 — End: 2017-04-08

## 2017-03-24 MED ORDER — METRONIDAZOLE 500 MG PO TAB
ORAL_TABLET | 0 refills | Status: SS
Start: 2017-03-24 — End: 2017-04-08

## 2017-03-24 MED ORDER — NEOMYCIN 500 MG PO TAB
ORAL_TABLET | 0 refills | Status: SS
Start: 2017-03-24 — End: 2017-04-08

## 2017-03-24 NOTE — Telephone Encounter
I called Dan Steele and informed him that his surgery will be scheduled 04/07/17. I will be sending his pre-op instrutions via mail. Pt informed to review this information and call our office if he has questions prior to the Friday before surgery. He understands this. His pre-op abx were sent to [facility] in [place]. No further needs.

## 2017-04-02 ENCOUNTER — Encounter: Admit: 2017-04-02 | Discharge: 2017-04-02 | Payer: MEDICARE

## 2017-04-02 NOTE — Telephone Encounter
Dan Steele called back to our office and left VM stating he is aware we desire to move his case to 12/11 instead of 12/12 as scheduled. He states kindly, "Just do it." He verbalizes in messages he does not require a call back from our office as he understands he will get a phone call on Tuesday afternoon regarding start time of the surgery and his bowel prep will need to be performed on Tuesday rather than Monday as he had planned.

## 2017-04-02 NOTE — Telephone Encounter
I called Boykin Reaper and left message stating we needed to speak with him regarding surgery date. Will await return call or try back later.

## 2017-04-08 ENCOUNTER — Encounter: Admit: 2017-04-08 | Discharge: 2017-04-08 | Payer: MEDICARE

## 2017-04-08 ENCOUNTER — Inpatient Hospital Stay: Admit: 2017-04-08 | Discharge: 2017-04-12 | Disposition: A | Discharge status: Home / Self Care | Payer: MEDICARE

## 2017-04-08 ENCOUNTER — Inpatient Hospital Stay: Admit: 2017-04-08 | Discharge: 2017-04-08 | Payer: MEDICARE

## 2017-04-08 DIAGNOSIS — K219 Gastro-esophageal reflux disease without esophagitis: ICD-10-CM

## 2017-04-08 DIAGNOSIS — C801 Malignant (primary) neoplasm, unspecified: ICD-10-CM

## 2017-04-08 DIAGNOSIS — I10 Essential (primary) hypertension: Principal | ICD-10-CM

## 2017-04-08 LAB — BLOOD GASES, ARTERIAL: Lab: 7.3 % — ABNORMAL LOW (ref 7.35–7.45)

## 2017-04-08 LAB — LACTIC ACID (BG - RAPID LACTATE): Lab: 1.3 MMOL/L — ABNORMAL LOW (ref 0.5–2.0)

## 2017-04-08 LAB — HEMOGLOBIN & HEMATOCRIT, BG: Lab: 11 g/dL — ABNORMAL LOW (ref 13.5–16.5)

## 2017-04-08 LAB — IONIZED CALCIUM,BG: Lab: 1 MMOL/L — ABNORMAL HIGH (ref 1.0–1.3)

## 2017-04-08 LAB — POTASSIUM, BG: Lab: 3.3 MMOL/L — ABNORMAL LOW (ref 3.5–5.1)

## 2017-04-08 LAB — GLUCOSE,BG: Lab: 140 mg/dL — ABNORMAL HIGH (ref 70–100)

## 2017-04-08 LAB — SODIUM,BG: Lab: 139 MMOL/L — ABNORMAL HIGH (ref 137–147)

## 2017-04-08 MED ORDER — HYDROMORPHONE 2 MG/ML IJ SOLN
0 refills | Status: DC
Start: 2017-04-08 — End: 2017-04-08
  Administered 2017-04-08 (×2): 0.5 mg via INTRAVENOUS
  Administered 2017-04-08: 17:00:00 .5 mg via INTRAVENOUS

## 2017-04-08 MED ORDER — ALBUMIN, HUMAN 5 % 500 ML IV SOLP (AN)(OSM)
0 refills | Status: DC
Start: 2017-04-08 — End: 2017-04-08
  Administered 2017-04-08: 16:00:00 via INTRAVENOUS

## 2017-04-08 MED ORDER — ELECTROLYTE-A IV SOLP
0 refills | Status: DC
Start: 2017-04-08 — End: 2017-04-08
  Administered 2017-04-08 (×2): via INTRAVENOUS

## 2017-04-08 MED ORDER — ACETAMINOPHEN 1,000 MG/100 ML (10 MG/ML) IV SOLN
0 refills | Status: DC
Start: 2017-04-08 — End: 2017-04-08
  Administered 2017-04-08: 20:00:00 1000 mg via INTRAVENOUS

## 2017-04-08 MED ORDER — DIPHENHYDRAMINE HCL 50 MG/ML IJ SOLN
25 mg | Freq: Once | INTRAVENOUS | 0 refills | Status: DC | PRN
Start: 2017-04-08 — End: 2017-04-08

## 2017-04-08 MED ORDER — DEXTRAN 70-HYPROMELLOSE (PF) 0.1-0.3 % OP DPET
0 refills | Status: DC
Start: 2017-04-08 — End: 2017-04-08
  Administered 2017-04-08: 14:00:00 2 [drp] via OPHTHALMIC

## 2017-04-08 MED ORDER — OXYCODONE 5 MG PO TAB
5-10 mg | Freq: Once | ORAL | 0 refills | Status: DC | PRN
Start: 2017-04-08 — End: 2017-04-08

## 2017-04-08 MED ORDER — LIDOCAINE-EPINEPHRINE 1 %-1:100,000 IJ SOLN
0 refills | Status: DC
Start: 2017-04-08 — End: 2017-04-08
  Administered 2017-04-08: 20:00:00 5 mL via SUBCUTANEOUS

## 2017-04-08 MED ORDER — PHENOL 1.4 % MM SPRA
2 | OROMUCOSAL | 0 refills | Status: DC | PRN
Start: 2017-04-08 — End: 2017-04-12

## 2017-04-08 MED ORDER — FENTANYL CITRATE (PF) 50 MCG/ML IJ SOLN
50 ug | INTRAVENOUS | 0 refills | Status: DC | PRN
Start: 2017-04-08 — End: 2017-04-08

## 2017-04-08 MED ORDER — FENTANYL CITRATE (PF) 50 MCG/ML IJ SOLN
12.5 ug | INTRAVENOUS | 0 refills | Status: DC | PRN
Start: 2017-04-08 — End: 2017-04-08

## 2017-04-08 MED ORDER — DEXTROSE 5%-0.45% SODIUM CHLORIDE & POTASSIUM CHLORIDE 20 MEQ/L IV SOLP
INTRAVENOUS | 0 refills | Status: DC
Start: 2017-04-08 — End: 2017-04-10
  Administered 2017-04-09: 20:00:00 1000.000 mL via INTRAVENOUS

## 2017-04-08 MED ORDER — FENTANYL CITRATE (PF) 50 MCG/ML IJ SOLN
0 refills | Status: DC
Start: 2017-04-08 — End: 2017-04-08
  Administered 2017-04-08: 14:00:00 100 ug via INTRAVENOUS

## 2017-04-08 MED ORDER — HYDRALAZINE 20 MG/ML IJ SOLN
10 mg | INTRAVENOUS | 0 refills | Status: DC | PRN
Start: 2017-04-08 — End: 2017-04-12

## 2017-04-08 MED ORDER — NALOXONE 0.4 MG/ML IJ SOLN
.08 mg | INTRAVENOUS | 0 refills | Status: DC | PRN
Start: 2017-04-08 — End: 2017-04-10

## 2017-04-08 MED ORDER — INDOCYANINE GREEN 25 MG IJ SOLR
0 refills | Status: DC
Start: 2017-04-08 — End: 2017-04-08
  Administered 2017-04-08: 18:00:00 7.5 mg via INTRAVENOUS

## 2017-04-08 MED ORDER — ACETAMINOPHEN 500 MG PO TAB
1000 mg | ORAL | 0 refills | Status: AC | PRN
Start: 2017-04-08 — End: ?
  Administered 2017-04-11 – 2017-04-12 (×2): 1000 mg via ORAL

## 2017-04-08 MED ORDER — PROMETHAZINE 25 MG/ML IJ SOLN
6.25 mg | Freq: Once | INTRAVENOUS | 0 refills | Status: DC
Start: 2017-04-08 — End: 2017-04-08

## 2017-04-08 MED ORDER — CEFOXITIN INJ 2GM IVP
2 g | INTRAVENOUS | 0 refills | Status: AC
Start: 2017-04-08 — End: ?
  Administered 2017-04-09 (×2): 2 g via INTRAVENOUS

## 2017-04-08 MED ORDER — ONDANSETRON HCL (PF) 4 MG/2 ML IJ SOLN
INTRAVENOUS | 0 refills | Status: DC
Start: 2017-04-08 — End: 2017-04-08
  Administered 2017-04-08: 20:00:00 4 mg via INTRAVENOUS

## 2017-04-08 MED ORDER — MIDAZOLAM 1 MG/ML IJ SOLN
INTRAVENOUS | 0 refills | Status: DC
Start: 2017-04-08 — End: 2017-04-08
  Administered 2017-04-08: 13:00:00 2 mg via INTRAVENOUS

## 2017-04-08 MED ORDER — FENTANYL CITRATE-0.9%NACL (PF) 550 MCG/55 ML IJ SPCA (COPY)
INTRAVENOUS | 0 refills | Status: DC
Start: 2017-04-08 — End: 2017-04-10
  Administered 2017-04-08 – 2017-04-10 (×2): 55.000 mL via INTRAVENOUS

## 2017-04-08 MED ORDER — LACTATED RINGERS IV SOLP
1000 mL | INTRAVENOUS | 0 refills | Status: DC
Start: 2017-04-08 — End: 2017-04-08
  Administered 2017-04-08: 17:00:00 1000.000 mL via INTRAVENOUS

## 2017-04-08 MED ORDER — CEFOXITIN 2 GRAM IV SOLR
0 refills | Status: DC
Start: 2017-04-08 — End: 2017-04-08
  Administered 2017-04-08 (×4): 2 g via INTRAVENOUS

## 2017-04-08 MED ORDER — LACTATED RINGERS IV SOLP
INTRAVENOUS | 0 refills | Status: AC
Start: 2017-04-08 — End: ?
  Administered 2017-04-08 – 2017-04-09 (×3): 1000.000 mL via INTRAVENOUS

## 2017-04-08 MED ORDER — FENTANYL CITRATE (PF) 50 MCG/ML IJ SOLN
25-50 ug | INTRAVENOUS | 0 refills | Status: DC | PRN
Start: 2017-04-08 — End: 2017-04-08

## 2017-04-08 MED ORDER — LIDOCAINE (PF) 200 MG/10 ML (2 %) IJ SYRG
0 refills | Status: DC
Start: 2017-04-08 — End: 2017-04-08
  Administered 2017-04-08: 14:00:00 100 mg via INTRAVENOUS

## 2017-04-08 MED ORDER — ONDANSETRON HCL (PF) 4 MG/2 ML IJ SOLN
4 mg | INTRAVENOUS | 0 refills | Status: DC | PRN
Start: 2017-04-08 — End: 2017-04-12
  Administered 2017-04-09: 18:00:00 4 mg via INTRAVENOUS

## 2017-04-08 MED ORDER — ENOXAPARIN 40 MG/0.4 ML SC SYRG
40 mg | Freq: Every day | SUBCUTANEOUS | 0 refills | Status: DC
Start: 2017-04-08 — End: 2017-04-12
  Administered 2017-04-09 – 2017-04-12 (×4): 40 mg via SUBCUTANEOUS

## 2017-04-08 MED ORDER — PHENYLEPHRINE IN 0.9% NACL(PF) 1 MG/10 ML (100 MCG/ML) IV SYRG
INTRAVENOUS | 0 refills | Status: DC
Start: 2017-04-08 — End: 2017-04-08
  Administered 2017-04-08 (×5): 100 ug via INTRAVENOUS

## 2017-04-08 MED ORDER — DEXAMETHASONE SODIUM PHOSPHATE 4 MG/ML IJ SOLN
INTRAVENOUS | 0 refills | Status: DC
Start: 2017-04-08 — End: 2017-04-08
  Administered 2017-04-08: 16:00:00 4 mg via INTRAVENOUS

## 2017-04-08 MED ORDER — ROCURONIUM 10 MG/ML IV SOLN
INTRAVENOUS | 0 refills | Status: DC
Start: 2017-04-08 — End: 2017-04-08
  Administered 2017-04-08 (×3): 20 mg via INTRAVENOUS
  Administered 2017-04-08: 16:00:00 10 mg via INTRAVENOUS
  Administered 2017-04-08: 14:00:00 30 mg via INTRAVENOUS
  Administered 2017-04-08: 14:00:00 40 mg via INTRAVENOUS

## 2017-04-08 MED ORDER — SUGAMMADEX 100 MG/ML IV SOLN
INTRAVENOUS | 0 refills | Status: DC
Start: 2017-04-08 — End: 2017-04-08
  Administered 2017-04-08: 20:00:00 198 mg via INTRAVENOUS

## 2017-04-08 MED ORDER — BUPIVACAINE 0.25 % (2.5 MG/ML) IJ SOLN
0 refills | Status: DC
Start: 2017-04-08 — End: 2017-04-08
  Administered 2017-04-08: 20:00:00 5 mL via SUBCUTANEOUS

## 2017-04-08 MED ORDER — PROPOFOL INJ 10 MG/ML IV VIAL
0 refills | Status: DC
Start: 2017-04-08 — End: 2017-04-08
  Administered 2017-04-08: 14:00:00 150 mg via INTRAVENOUS
  Administered 2017-04-08: 14:00:00 50 mg via INTRAVENOUS

## 2017-04-08 MED ORDER — EPHEDRINE SULFATE 50 MG/ML IJ SOLN
0 refills | Status: DC
Start: 2017-04-08 — End: 2017-04-08
  Administered 2017-04-08: 14:00:00 10 mg via INTRAVENOUS

## 2017-04-08 MED ORDER — LIDOCAINE (PF) 10 MG/ML (1 %) IJ SOLN
.1-2 mL | INTRAMUSCULAR | 0 refills | Status: DC | PRN
Start: 2017-04-08 — End: 2017-04-08

## 2017-04-08 MED ADMIN — LACTATED RINGERS IV SOLP [4318]: 1000 mL | INTRAVENOUS | @ 12:00:00 | Stop: 2017-04-08 | NDC 00338011704

## 2017-04-08 NOTE — Other
Brief Operative Note    Name: Dan Steele is a 66 y.o. male     DOB: [birth year]             MRN#: [id number]  DATE OF OPERATION: 04/08/2017    Date:  04/08/2017        Preoperative Dx:   1) Rectal cancer ([place]) [C20]    Post-op Diagnosis   1) Rectal cancer ([place]) [C20]    Procedure(s):  1) Robotic low anterior resection  2) Splenic flexure mobilization  3) Flexible sigmoidoscopy    Anesthesia Type: GETA    Surgeon(s) and Role:     Rockne Coons, MD - Primary     * Darnelle Catalan, MD - Resident - Assisting    Findings:  Tumor located at 13cm, 29 EEA anastomosis at 10cm from the anal verge, 3cm gross distal margin after specimen inspected on the back table, negative leak test with intact doughnuts    Estimated Blood Loss: No blood loss documented.     Specimen(s) Removed/Disposition:   ID Type Source Tests Collected by Time Destination   1 : rectum and sigmoid Tissue Rectum SURGICAL PATHOLOGY          Rockne Coons, MD 04/08/2017 1219    2 : Distal donut Tissue Colon SURGICAL PATHOLOGY          Rockne Coons, MD 66/09/3014 0109      Complications:  None    Implants: None    Drains: None    Disposition:  PACU - stable    Hassell Done, MD  Colon and Rectal Surgery  Pager: [phone]

## 2017-04-08 NOTE — Anesthesia Procedure Notes
Anesthesia Procedure: Arterial Line Placement    A-LINE INSERTION  Date/Time: 04/08/2017 7:35 AM    Patient location: OR  Indications: multiple ABGs and hemodynamic monitoring      Preprocedure checklist performed: 2 patient identifiers, risks & benefits discussed, patient evaluated, timeout performed, consent obtained, patient being monitored and sterile drape    Sterile technique:  - Proper hand washing  - Cap, mask  - Sterile gloves  - Skin prep for antisepsis        Arterial Line Procedure   Patient sedated: yes (see MAR)  Sedation type: general;   Artery prepped with chlorhexidine; skin prep agent completely dried prior to procedure.  Location: radial artery  Laterality: right  Technique: palpation  Needle gauge: 20 G  Number of attempts: 1    Procedure Outcome  Catheter secured with adhesive dressing applied  Events: no complications noted during insertion and skin intact, warm, and dry    Observation: pt tolerated well    Additional notes:   I personally performed the procedure myself.        Performed by: Valetta Fuller, MD  Authorized by: Valetta Fuller, MD

## 2017-04-08 NOTE — Anesthesia Post-Procedure Evaluation
Post-Anesthesia Evaluation    Name: Dan Steele      MRN: [id number]     DOB: 12-29-1950     Age: 66 y.o.     Sex: male   __________________________________________________________________________     Procedure Date: 04/08/2017  Procedure: Procedure(s) with comments:  ROBOTIC LOW ANTERIOR RESECTION, FLEXIBLE SIGMOIDOSCOPY, - CASE LENGTH 4 HOURS, CLIPPING AND HIBICLENS PREP TO BE DONE IN SDS/PRE-POST, XI ROBOT REQUIRED, REQUEST 0715 START      Surgeon: Surgeon(s):  Rockne Coons, MD  Darnelle Catalan, MD    Post-Anesthesia Vitals  BP: 129/76 (12/12 1515)  Pulse: 80 (12/12 1515)  Respirations: 15 PER MINUTE (12/12 1515)  SpO2: 96 % (12/12 1515)  O2 Delivery: Nasal Cannula (12/12 1515)  SpO2 Pulse: 80 (12/12 1515)      Post Anesthesia Evaluation Note    Evaluation location: Pre/Post  Patient participation: recovered; patient participated in evaluation  Level of consciousness: alert    Pain score: Pain scale: adequate.  Pain management: adequate    Hydration: normovolemia  Temperature: 36.0C - 38.4C  Airway patency: adequate    Perioperative Events  Perioperative events:  no       Post-op nausea and vomiting: no PONV    Postoperative Status  Cardiovascular status: hemodynamically stable  Respiratory status: spontaneous ventilation  Follow-up needed: none        Perioperative Events  Perioperative Event: No  Emergency Case Activation: No

## 2017-04-08 NOTE — Interval H&P Note
History and Physical Update Note    No changes from last visit. OR today for MIS LAR, flex sigmoid, possible ostomy.     Allergies:  Patient has no known allergies.    Lab/Radiology/Other Diagnostic Tests:  24-hour labs:  No results found for this visit on 04/08/17 (from the past 24 hour(s)).  Point of Care Testing:  (Last 24 hours):         I have examined the patient, and there are no significant changes in their condition, from the previous H&P performed on 03/12/17.    Bertram Millard, MD  Pager     --------------------------------------------------------------------------------------------------------------------------------------------

## 2017-04-08 NOTE — Progress Notes
15: Paged ostomy team to patient bedside to mark for possible ostomy.    9373: Ostomy team at patient bedside to mark.

## 2017-04-08 NOTE — Progress Notes
Patient arrived on unit via cart  accompanied by transport . Patient transferred from cart  to bed with std by assistance . Assessment completed, refer to flowsheet for details. Orders released, reviewed, and implemented as appropriate. Oriented to surroundings, call light within reach. Plan of care reviewed.  Will continue to monitor and assess.

## 2017-04-09 LAB — MAGNESIUM: Lab: 2.3 mg/dL — ABNORMAL HIGH (ref >60)

## 2017-04-09 LAB — CBC: Lab: 10 K/UL (ref 4.5–11.0)

## 2017-04-09 LAB — BASIC METABOLIC PANEL: Lab: 140 MMOL/L — ABNORMAL LOW (ref 137–147)

## 2017-04-09 MED ORDER — SIMETHICONE 80 MG PO CHEW
80 mg | ORAL | 0 refills | Status: DC | PRN
Start: 2017-04-09 — End: 2017-04-12
  Administered 2017-04-09: 20:00:00 80 mg via ORAL

## 2017-04-09 NOTE — Progress Notes
PHYSICAL THERAPY  NOTE         Patient up in bedside chair but pleasantly declines participation in therapy at this time secondary to upset stomach and extremely loose BMs.  However reports no concerns when mobilizing with OT earlier in the day today and eager to walk with nursing staff later if stomach feeling better.  Will continue to follow and provide intervention as appropriate.    Therapist: Delsa Sale, PT, DPT, MHAM  Date: 04/09/2017

## 2017-04-09 NOTE — Progress Notes
RT Adult Assessment Note    NAME:Dan Steele             MRN: [id number]             DOB:Jan 27, 1951          AGE: 66 y.o.  ADMISSION DATE: 04/08/2017             DAYS ADMITTED: LOS: 0 days    RT Treatment Plan:            Additional Comments:  Impressions of the patient: none  Intervention(s)/outcome(s): none  Patient education that was completed: none  Recommendations to the care team: none    Vital Signs:  Pulse: Pulse: 80  RR: Respirations: 18 PER MINUTE  SpO2: SpO2: 95 %  O2 Device:    Liter Flow:    O2%: O2 Percent: 21 %  Breath Sounds: All Breath Sounds: Clear (implies normal)  Respiratory Effort: Respiratory Effort: Non-Labored

## 2017-04-09 NOTE — Progress Notes
OCCUPATIONAL THERAPY  ASSESSMENT NOTE    Patient Name: Dan Steele                   Room/Bed: VF6433/29  Admitting Diagnosis:  Rectal cancer (HCC) [C20]      Mobility  Progressive Mobility Level: Walk laps  Distance Walked (feet): 650 ft  Level of Assistance: Stand by assistance  Assistive Device: None  Time Tolerated: 11-30 minutes  Activity Limited By: Pain    Subjective  Pertinent Dx per Physician: 66 yo M with HTN, GERD, and rectal cancer s/p robotic LAR on 04/08/17.  Precautions: Falls  Pain / Complaints: Patient agrees to participate in therapy;Patient encouraged to use PCA (IV)  Pain Location: Incisional  Pain Level Current: (did not rate)    Objective  Psychosocial Status: Willing and Cooperative to Participate    Home Living  Type of Home: House  Home Layout: Multi-level(14 steps to main level from garage)  Financial risk analyst / Tub: Pension scheme manager: Standard  Bathroom Equipment: Engineer, materials in Air traffic controller;Shower Quarry manager in [employer];Toilet Riser  Home Equipment: Engineer, materials    Prior Function  Level Of Independence: Independent with ADLs and functional transfers;Independent with homemaking w/ ambulation  Lives With: (Mother and brother)  Receives Help From: None Needed  Vocational: Retired  Leisure: Librarian, academic)    ADL's  Where Assessed: Standing at [employer];In Bathroom  Eating Assist: Independent  Eating Deficits: No Assist Needed  Grooming Assist: Stand By Assist  Grooming Deficits: Setup;Teeth Care  Functional Transfer Assist: Stand By Assist  Comment: Pt completed bed mobility with stand by assistance and HOB elevated.  Pt completed room mobility and hallway ambulation with stand by assistance.  Pt sitting in chair at end of session.      Activity Tolerance  Endurance: 3/5 Tolerates 25-30 Minutes Exercise w/Multiple Rests  Sitting Balance: 4/5 Moves/Returns Trunkal Midpoint 1-2 Inches in Multiple Planes    Cognition  Overall Cognitive Status: WNL    Education Persons Educated: Patient  Barriers To Learning: None Noted  Teaching Methods: Verbal Instruction  Patient Response: Verbalized Understanding  Topics: Role of OT, Goals for Therapy;Home safety;ADL Compensatory Techniques;Adaptive Devices for ADLs;Energy Conservation  Goal Formulation: With Patient  Comments: Discussed LE compensatory techniques.      Assessment  Assessment: Decreased ADL Status;Decreased High-Level ADLs  Prognosis: Good;w/ Family    AM-PAC 6 Clicks Daily Activity Inpatient  Putting on and taking off regular lower body clothes?: A Little  Bathing (Including washing, rinsing, drying): A Little  Toileting, which includes using toilet, bedpan, or urinal: None  Putting on and taking off regular upper body clothing: None  Taking care of personal grooming such as brushing teeth: None  Eating meals?: None  Daily Activity Raw Score: 22  Standardized (t-scale) score: 47.1  CMS 0-100% Score: 25.8  CMS G Code Modifier: CJ    Plan  OT Frequency: Follow-Up Visit Only  OT Plan for Next Visit: LE dressing, Toileting, Toilet transfer.     ADL Goals  Patient Will Perform LE Dressing: w/ Stand By Assist  Patient Will Perform Toileting: w/ Stand By Assist    OT Discharge Recommendations  OT Discharge Recommendations: Home with family assist  Equipment Recommendations: Patient owns necessary equipment    G-Codes: Self-care  [phone] Current Status: 20-39% Impairment   [id number] Goal Status:  1-19% Impairment     Based on above evaluation and clinical judgment.      Therapist: Maxine Glenn Miloh Alcocer, OTR/L  Date: 04/09/2017

## 2017-04-09 NOTE — Progress Notes
Surgery Progress Note 04/09/2017     Patient: Dan Steele  MRN: [id number]  Admission date 04/08/2017, LOS: 1 day    ASSESSMENT: 66 yo M with HTN, GERD, and rectal cancer s/p robotic LAR on 04/08/17.   Active Problems:    Rectal cancer (HCC)    PLAN:  - ADAT, continue mIVF until tolerating PO intake: D5 & 0.45% NS with 20 KCl @ 50 mL/hr   - D/C Foley  - Pain Control: Tylenol PRN, Fentanyl PCA  - HTN: IV hydralazine PRN     --> hold PTA Lisinopril, HCTZ   - DVT PPX: Lovenox, SCDs  - PRN: phenol, Zofran   - Encourage ambulation, OOB, PT/OT consulted     Dispo: pending return of bowel function, PO pain control     Patient seen and discussed with Dr. Daphine Deutscher, who directed the plan of care  ________________________________________________________________________   Subjective:  No acute events overnight. Pt states that his pain is controlled this AM. He denies N/V with a CLD. He has not ambulated yet.     Objective:  BP 102/56 (BP Source: Arm, Right Upper)  - Pulse 81  - Temp 37.4 ???C (99.3 ???F)  - Ht 177.8 cm (70)  - Wt 98.8 kg (217 lb 12.8 oz)  - SpO2 98%  - BMI 31.25 kg/m???     Physical Exam:  General: alert, cooperative, NAD  HEENT: normocephalic/atraumatic, non-icteric  Cardio: regular rate, regular rhythm   Pulm: non-labored respirations on RA   Abd: soft, non-distended, appropriately TTP, incisions C/D/I with Dermabond in place, no erythema, drainage, or edema   Ext: warm, dry, no edema/cyanosis  Neuro: grossly intact  Psych: behavior and mood appropriate      LABS:  Recent Labs      04/08/17   1130  04/09/17   0538   HGB   --   12.7*   HCT   --   36.9*   WBC   --   10.0   PLTCT   --   159   NA  139  140   K  3.3*  4.0   CL   --   109   CO2   --   26   BUN   --   10   CR   --   0.93   GLU  140*  119*   CA   --   8.8   MG   --   2.3   Glucose: (!) 119 (04/09/17 0538)    Intake/Output Summary (Last 24 hours) at 04/09/2017 0732  Last data filed at 04/09/2017 0428  Gross per 24 hour   Intake 6399 ml   Output 2230 ml Net 4169 ml     Maretta Los, MD [phone]

## 2017-04-10 ENCOUNTER — Encounter: Admit: 2017-04-10 | Discharge: 2017-04-10 | Payer: MEDICARE

## 2017-04-10 DIAGNOSIS — C801 Malignant (primary) neoplasm, unspecified: ICD-10-CM

## 2017-04-10 DIAGNOSIS — I10 Essential (primary) hypertension: Principal | ICD-10-CM

## 2017-04-10 DIAGNOSIS — K219 Gastro-esophageal reflux disease without esophagitis: ICD-10-CM

## 2017-04-10 LAB — CBC: Lab: 7.4 K/UL — ABNORMAL HIGH (ref 4.5–11.0)

## 2017-04-10 LAB — MAGNESIUM: Lab: 2.1 mg/dL — ABNORMAL LOW (ref >60)

## 2017-04-10 LAB — BASIC METABOLIC PANEL: Lab: 138 MMOL/L — ABNORMAL LOW (ref >60)

## 2017-04-10 MED ORDER — OXYCODONE 5 MG PO TAB
5-10 mg | ORAL | 0 refills | Status: DC | PRN
Start: 2017-04-10 — End: 2017-04-12

## 2017-04-10 MED ORDER — ALUMINUM-MAGNESIUM HYDROXIDE 200-200 MG/5 ML PO SUSP
30 mL | Freq: Once | ORAL | 0 refills | Status: CP
Start: 2017-04-10 — End: ?
  Administered 2017-04-10: 15:00:00 30 mL via ORAL

## 2017-04-10 MED ORDER — POTASSIUM CHLORIDE 20 MEQ PO TBTQ
20 meq | Freq: Once | ORAL | 0 refills | Status: CP
Start: 2017-04-10 — End: ?
  Administered 2017-04-10: 15:00:00 20 meq via ORAL

## 2017-04-10 MED ORDER — ASPIRIN 81 MG PO TBEC
81 mg | Freq: Every day | ORAL | 0 refills | Status: DC
Start: 2017-04-10 — End: 2017-04-12
  Administered 2017-04-10 – 2017-04-12 (×3): 81 mg via ORAL

## 2017-04-10 NOTE — Progress Notes
PHYSICAL THERAPY  NOTE         Orders received and chart reviewed.  Patient reports ambulating in the room and observed ambulating 148ft on the unit without difficulty without assistive device. Navigating up 6 steps independently.  Patient reports no concerns with mobility at home and has family to assist at discharge. Now up ad lib in the room.  Physical therapy services discontinued at this time.      Therapist: Delsa Sale, PT, DPT, MHAM  Date: 04/10/2017

## 2017-04-10 NOTE — Operative Report(Direct Entry)
OPERATIVE REPORT    Name: Dan Steele is a 66 y.o. male     DOB: 1951/02/28             MRN#: [id number]    DATE OF OPERATION: 04/08/2017    Surgeon(s) and Role:     Benetta Spar, MD - Primary     * Alonna Minium, MD - Resident - Assisting     Preoperative Diagnosis:    1) Rectal cancer (HCC) [C20]    Post-op Diagnosis   1) Rectal cancer (HCC) [C20]    Procedure(s):  1) Robotic low anterior resection  2) Splenic flexure mobilization  3) Flexible sigmoidoscopy    Anesthesia Type: GETA    Indication for Procedure:  Dan Steele is a 66 year-old male diagnosed with a MSS rectal adenocarcinoma during screening colonoscopy on 02/19/17.  Staging workup was consistent with a T2N0M0 lesion located 13cm from the anal verge.  The risks and benefits of a robotic assisted low anterior resection, possible open, possible ostomy were discussed in detail including risks of anastomotic leak, injury to surrounding structures (ureter, nerves), bleeding, and infection were discussed in detail and he agreed to proceed.    Procedure in Detail:  Dan Steele???was taken to the operating room where he was transferred from the stretcher to the OR table in a supine position. ???He underwent induction of general anesthesia with placement of an endotracheal tube???without difficulty and was then???repositioned in???lithotomy. A???sterile foley catheter was placed. ???Both arms were padded and tucked to the sides before a???beanbag which had been placed underneath him was suctioned down to prevent him???from sliding. ???A???strap was applied over the chest. ??????A timeout was performed verifying the patient's identity,???as well as the surgical site, and???surgery to be performed.  The abdomen was then prepped with chlorhexidine. ???Two grams of cefoxitin was given intravenously prior to incision. ???Sterile drapes were applied in the usual fashion.  ???We began by making a small stab incision over Palmer's point in the left upper quadrant. ???A Veress Steele was then inserted and its position was confirmed with the saline drop test. ???The abdomen was then insufflated to 15 mmHg with CO2. ???We marked out our future robotic sites along the right side of the abdomen extending from the ASIS up towards the xiphoid. ???A #11 blade scalpel was used to create a 8???mm incision through which a robotic trocar was placed in the right mid-abdomen. ???The robotic camera was then inserted thru???the trocar and the sites of entry were inspected for inadvertent injury. ???There were no signs of injury or peritoneal metastasis. ???We then used a Steele to localize the entry sites for our future trocars and infiltrated the peritoneum at these sites with 0.25% ???Marcaine and 1% lidocaine with 1:100,000 epinephrine mixed???50:50. ???The robotic trocars for arms 1 and 3???were then placed under direct visualization. ???At the site of arm 4???in the lower right mid-abdomen we made a 30mm incision and carried this down to transversely incision the fascia sparing the rectus abdominis and inserted a small Alexis lap cap system. ???We inserted the 12mm robotic port thru the lap cap. ???Finally, we placed a 5 mm Airseal???assistant port in the right lateral abdomen. ???Dan Steele???was positioned in steep Trendelenberg with some right side down.??????The Xi robot was then brought in and docked with targeting performed over the mid-sigmoid???colon. ???A fenestrated bipolar, monopolar scissors, and a cadiere were used for the beginning of the case. ???The small bowel was reduced from the pelvis.  We began by opening the peritoneum over the  sacral promontory and beginning a medial to lateral dissection.  We had some difficulty identifying the left ureter and switched to the lateral approach for some time to identify and preserve the left ureter.  We worked cephalad to skeletonize the inferior mesenteric artery at its origin from the aorta and then performed a high ligation with three burns of the vessel sealer.  We continued opening the peritoneum up to the ligament of Treitz where we divided the inferior mesenteric vein with three burns of the vessel sealer.  A medial to lateral dissection was then performed to separate the mesentery from the retroperitoneum as it projected up towards the splenic flexure.  The medial to lateral dissection was completed and the lateral peritoneal attachments taken down up to the splenic flexure.  Dan Steele's colon was very redundant and at this point we had enough length to proceed into the pelvis.  We entered the total mesorectal excision plane posteriorly and followed this down to the pelvis floor using it as a guide to take down the lateral attachments.  The peritoneal reflection was sharply incised taking care to identify the seminal vesicles and leave Denonvillier's fascia intact.  The dissection was extremely difficult due to his body habitus and redundant peritoneum which dropped down in the visual field.  Further dissection was facilitated by the placement of an additional 5mm assistant port in the right upper lateral abdomen.  The flexible sigmoidoscope was introduced to assist in positioning the robotic stapler past the rectal mass.  Multiple green load fires of the 45mm robotic stapler were used to divide the distal rectum in a top down fashion.  We retracted the specimen up and out of the pelvis.  Arm #1 was removed from the [place] along with the cap and we attempted to exteriorize the specimen.  We were able to partially do this but it seemed tethered.  Therefore we reinsufflated to visualize the specimen/future conduit and sharply took down some lateral attachments leading up to the splenic flexure which had not been completely mobilized.  We injected 3mL of indocyanine green and used the Firefly to assess perfusion before creating a window in the distal descending colon mesentery at the site of demarcation.  We divided the colon with one blue load fire of the 45mm robotic stapler.  Arm #1 was again removed from the [place] along with the cap and the specimen removed.  We could palpate the tumor with a good 2cm distal margin within the specimen.  This was opened on the back table to confirm the grossly negative distal margin.  We appeared to have a good 2-3cm distal margin past the mass.  The specimen was sent to pathology as rectosigmoid in formalin.  The distal portion of the conduit was exteriorized through the right lower quadrant Alexis and the staple line removed before placing a two row 2-0 prolene pursestring suture through which the 29 EEA anvil was passed and secured down.  This was reintroduced into the abdomen.??? The conduit easily reached for a tension free anastomosis. ???The EEA sizers were sequentially passed up from below.  The 29 EEA stapler was advanced and the spike brought out to the left of the staple line. ???After ensuring the mesentery was straight the spike and anvil were coupled. ???The EEA stapler was coupled and fired with 30 second pauses between each step. ???Both donuts were intact.  The distal donut was sent in formalin for pathologic examination as distal donut.  On flexible sigmoidoscopy the anastomosis was patent  and without bleeding at 10cm from the anal verge.  The pelvis was filled with water and an air leak test performed which was negative.  We desufflated the abdomen, removed the robotic trocars, and???closed the fascia of the right lower quadrant incision with five #1 PDS figure of eight sutures. ???All incisions were closed with 4-0 monocryl sutures and then dermabond was applied. The drapes were removed. ???Mikias???was flattened out and then extubated before being transferred to the stretcher and on to PACU in stable condition. ???All sponge, Steele, and instrument counts were correct at the end of the operation.    Estimated Blood Loss:  100 ml Specimen(s) Removed/Disposition:   ID Type Source Tests Collected by Time Destination   1 : rectum and sigmoid Tissue Rectum SURGICAL PATHOLOGY          Benetta Spar, MD 04/08/2017 1219    2 : Distal donut Tissue Colon SURGICAL PATHOLOGY          Benetta Spar, MD 04/08/2017 1236      Attestation: I performed this procedure with a resident.    Deedra Ehrich, MD  Colon and Rectal Surgery  Pager: [phone]

## 2017-04-10 NOTE — Progress Notes
OCCUPATIONAL THERAPY    Per chart review and per RN, patient is up ad lib in room, ambulating without difficulty. Patient reports he was up taking a shower earlier today and has been ambulating to and from bathroom. Patient endorses no concerns with functional skills upon discharge, reports no occupational therapy concerns at this time. Occupational therapy services will be discontinued at this time, please re-consult if the patient has a change in functional status.      Therapist: Lonell Face, OTR/L [phone]  Date: 04/10/2017

## 2017-04-10 NOTE — Progress Notes
MD Hessel spoke to about pts low(ish) urine output. MD wants to see oral intake to increase before doing anything else. WCTM.

## 2017-04-10 NOTE — Case Management (ED)
Case Management Admission Assessment    NAME:Dan Steele                          MRN: [id number]             DOB:July 12, 1950          AGE: 66 y.o.  ADMISSION DATE: 04/08/2017             DAYS ADMITTED: LOS: 2 days      Today???s Date: 04/10/2017    Source of Information:Patient      Plan  Plan: Case Management Assessment, Assist PRN with SW/NCM Services, Discharge Planning for Home Anticipated  Anticipate dc home tomorrow vs Sunday pending medical stability. Brother; Dan Steele will provide transportation.     [facility] was [facility] for: HTN, GERD, and rectal cancer s/p robotic LAR on 04/08/17.     SW met with pt at bedside to verify demographic information and to discuss discharge plans. Pt stated he was living at home independently prior to admission. Stated he lives with Dan Steele and his mother who would be able to provide care and support following hospitalization.   Pt denies prior use of DME, O2 or HH.  Also denies prior admission to SNF/IPR/LTACH.    CM will continue to follow for discharge planning. No needs identified at this time.     Patient Address/Phone  [address]  [place] [address]  [phone] (home)     Emergency Contact  Extended Emergency Contact Information  Primary Emergency Contact: Dan Steele, Dan Steele  Home Phone: [phone]  Relation: Brother    Forensic scientist: No, patient does not have a healthcare directive  Would patient like to fill out a (a new) Editor, commissioning?: No, patient declined  Psych Advance Directive (Psych unit only): No, patient does not have a Psych Advance Directive  Pt does not wish to complete DPOA at this time.     Transportation  Does the patient need discharge transport arranged?: No  Transportation Name, Phone and Availability #1: BrotherOnalee Steele [phone]    Expected Discharge Date  Expected Discharge Date: 04/11/17    Living Situation Prior to Admission  ? Living Arrangements  Type of Residence: Home, independent Living Arrangements: Family members  Financial risk analyst / Tub: Psychologist, counselling  How many levels in the residence?: 2  Can patient live on one level if needed?: No  Does residence have entry and/or side stairs?: Yes(5-14 steps)  Assistance needed prior to admit or anticipated on discharge: No  Who provides assistance or could if needed?: brother; Dan Steele or mother  Are they in good health?: Yes  Can support system provide 24/7 care if needed?: No  ? Level of Function   Prior level of function: Independent  ? Cognitive Abilities   Cognitive Abilities: Alert and Oriented, Engages in problem solving and planning, Participates in decision making    Financial Resources  ? Coverage  Primary Insurance: Medicare  Secondary Insurance: Medicare Supplement    ? Source of Income   Source Of Income: Other retirement income  ? Financial Assistance Needed?  N/A    Psychosocial Needs  ? Mental Health  Mental Health History: No  ? Substance Use History  Substance Use History Screen: No  ? Other  N/A    Current/Previous Services  ? PCP  Wilford Grist, [phone], [phone]  ? Pharmacy    [facility] [phone] [facility] [place],  - [address]  [address]  [place] [address]  Phone: [phone] Fax: [phone]    [facility]  [address]  [place] [phone]  [place] [address]  Phone: [phone] Fax: [phone]    ? Durable Medical Equipment   Durable Medical Equipment at home: None  ? Home Health  Receiving home health: No  ? Hemodialysis or Peritoneal Dialysis  Undergoing hemodialysis or peritoneal dialysis: No  ? Tube/Enteral Feeds  Receive tube/enteral feeds: No  ? Infusion  Receive infusions: No  ? Private Duty  Private duty help used: No  ? Home and Community Based Services  Home and community based services: No  ? [employer]: N/A  ? Hospice  Hospice: No  ? Outpatient Therapy  PT: No  OT: No  SLP: No  ? Skilled Nursing Facility/Nursing Home  SNF: No  NH: No  ? Inpatient Rehab  IPR: No ? Long-Term Acute Care Hospital  LTACH: No  ? Acute Hospital Stay  Acute Hospital Stay: No    Charleston Ropes, LMSW  [phone]

## 2017-04-10 NOTE — Progress Notes
Surgery Progress Note 04/10/2017     Patient: Dan Steele  MRN: [id number]  Admission date 04/08/2017, LOS: 2 days    ASSESSMENT: 66 yo M with HTN, GERD, and rectal cancer s/p robotic LAR on 04/08/17.   Active Problems:    Rectal cancer (HCC)    PLAN:  - Regular diet   - Pain Control: Tylenol PRN, Oxy PRN   - HTN: IV hydralazine PRN     --> hold PTA Lisinopril, HCTZ   - DVT PPX: Lovenox, SCDs  - PRN: phenol, Zofran   - Encourage ambulation, OOB, PT/OT consulted       Patient discussed with Dr. Hassell Done, who directed the plan of care  ________________________________________________________________________   Subjective:  No acute events overnight. Pt states that his pain is controlled this AM. He denies N/V. Passing gas and had a BM.     Objective:  BP 127/70 (BP Source: Arm, Right Upper)  - Pulse 84  - Temp 37.4 C (99.4 F)  - Ht 177.8 cm (70")  - Wt 98.8 kg (217 lb 12.8 oz)  - SpO2 95%  - BMI 31.25 kg/m     Physical Exam:  General: alert, cooperative, NAD  HEENT: normocephalic/atraumatic, non-icteric  Cardio: regular rate, regular rhythm   Pulm: non-labored respirations on RA   Abd: soft, non-distended, appropriately TTP, incisions C/D/I with Dermabond in place, no erythema, drainage, or edema   Ext: warm, dry, no edema/cyanosis  Neuro: grossly intact  Psych: behavior and mood appropriate      LABS:  Recent Labs      04/08/17   1130  04/09/17   0538  04/10/17   0434   HGB   --   12.7*  11.9*   HCT   --   36.9*  34.2*   WBC   --   10.0  7.4   PLTCT   --   159  140*   NA  139  140  138   K  3.3*  4.0  3.7   CL   --   109  107   CO2   --   26  27   BUN   --   10  9   CR   --   0.93  0.70   GLU  140*  119*  145*   CA   --   8.8  8.4*   MG   --   2.3  2.1   Glucose: (!) 145 (04/10/17 0434)    Intake/Output Summary (Last 24 hours) at 04/10/2017 1051  Last data filed at 04/10/2017 0800  Gross per 24 hour   Intake 2355.1 ml   Output 475 ml   Net 1880.1 ml     Bertram Millard, MD [phone]

## 2017-04-11 LAB — CBC: Lab: 7 K/UL — ABNORMAL LOW (ref 4.5–11.0)

## 2017-04-11 LAB — MAGNESIUM: Lab: 2.2 mg/dL — ABNORMAL HIGH (ref 1.6–2.6)

## 2017-04-11 LAB — BASIC METABOLIC PANEL: Lab: 141 MMOL/L — ABNORMAL LOW (ref 137–147)

## 2017-04-11 NOTE — Progress Notes
I have reviewed the notes, assessment, and/or procedures performed by Percell Belt RN and concur with her documentation unless otherwise noted.

## 2017-04-11 NOTE — Progress Notes
Surgery Progress Note 04/11/2017     Patient: Dan Steele  MRN: [id number]  Admission date 04/08/2017, LOS: 3 days    ASSESSMENT: 66 yo M with HTN, GERD, and rectal cancer s/p robotic LAR on 04/08/17.   Active Problems:    Rectal cancer (HCC)    PLAN:  - Regular diet   - Pain Control: Tylenol PRN, Oxy PRN   - HTN: IV hydralazine PRN     --> hold PTA Lisinopril, HCTZ   - DVT PPX: Lovenox, SCDs  - PRN: phenol, Zofran   - Encourage ambulation, OOB  - Dispo: D/c likely tomorrow pending optimized pain control       Patient discussed with Dr. Daphine Deutscher, who directed the plan of care  ________________________________________________________________________   Subjective:  No acute events overnight. He denies N/V. Passing gas and had a BM. Has some pain.     Objective:  BP 123/79 (BP Source: Arm, Right Upper)  - Pulse 81  - Temp 37.1 ???C (98.7 ???F)  - Ht 177.8 cm (70)  - Wt 98.8 kg (217 lb 12.8 oz)  - SpO2 97%  - BMI 31.25 kg/m???     Physical Exam:  General: alert, cooperative, NAD  HEENT: normocephalic/atraumatic, non-icteric  Cardio: regular rate, regular rhythm   Pulm: non-labored respirations on RA   Abd: soft, non-distended, appropriately TTP, incisions C/D/I with Dermabond in place, no erythema, drainage, or edema   Ext: warm, dry, no edema/cyanosis  Neuro: grossly intact  Psych: behavior and mood appropriate      LABS:  Recent Labs      04/08/17   1130  04/09/17   0538  04/10/17   0434  04/11/17   0518   HGB   --   12.7*  11.9*  11.9*   HCT   --   36.9*  34.2*  33.7*   WBC   --   10.0  7.4  7.0   PLTCT   --   159  140*  143*   NA  139  140  138  141   K  3.3*  4.0  3.7  3.6   CL   --   109  107  109   CO2   --   26  27  25    BUN   --   10  9  9    CR   --   0.93  0.70  0.66   GLU  140*  119*  145*  103*   CA   --   8.8  8.4*  8.6   MG   --   2.3  2.1  2.2   Glucose: (!) 103 (04/11/17 0518)    Intake/Output Summary (Last 24 hours) at 04/11/2017 0935  Last data filed at 04/11/2017 0920  Gross per 24 hour   Intake 1340 ml Output 300 ml   Net 1040 ml     Jonette Eva, MD [phone]

## 2017-04-12 DIAGNOSIS — I10 Essential (primary) hypertension: ICD-10-CM

## 2017-04-12 DIAGNOSIS — Z87891 Personal history of nicotine dependence: ICD-10-CM

## 2017-04-12 DIAGNOSIS — C20 Malignant neoplasm of rectum: Principal | ICD-10-CM

## 2017-04-12 DIAGNOSIS — D125 Benign neoplasm of sigmoid colon: ICD-10-CM

## 2017-04-12 DIAGNOSIS — K219 Gastro-esophageal reflux disease without esophagitis: ICD-10-CM

## 2017-04-12 LAB — CBC: Lab: 4.8 K/UL (ref 4.5–11.0)

## 2017-04-12 LAB — BASIC METABOLIC PANEL: Lab: 141 MMOL/L — ABNORMAL LOW (ref 137–147)

## 2017-04-12 LAB — MAGNESIUM: Lab: 2.1 mg/dL — ABNORMAL LOW (ref 1.6–2.6)

## 2017-04-12 MED ORDER — POTASSIUM CHLORIDE 20 MEQ PO TBTQ
20 meq | Freq: Once | ORAL | 0 refills | Status: CP
Start: 2017-04-12 — End: ?
  Administered 2017-04-12: 15:00:00 20 meq via ORAL

## 2017-04-12 MED ORDER — OXYCODONE 5 MG PO TAB
5-10 mg | ORAL_TABLET | ORAL | 0 refills | 6.00000 days | Status: AC | PRN
Start: 2017-04-12 — End: 2018-04-01

## 2017-04-12 MED ORDER — ALUMINUM-MAGNESIUM HYDROXIDE 200-200 MG/5 ML PO SUSP
30 mL | Freq: Once | ORAL | 0 refills | Status: CP
Start: 2017-04-12 — End: ?
  Administered 2017-04-12: 15:00:00 30 mL via ORAL

## 2017-04-12 MED ORDER — POLYETHYLENE GLYCOL 3350 17 GRAM PO PWPK
17 g | Freq: Two times a day (BID) | ORAL | 3 refills | 18.00000 days | Status: AC
Start: 2017-04-12 — End: 2018-04-01

## 2017-04-12 NOTE — Progress Notes
Discharge instructions given to pt regarding pain management and opioid safety, bowel regimen, follow-up appointment, weight lifting restrictions, diet, SSI to watch for and report to MD. IVs removed by night RN. Pt ambulated off unit with nursing staff. All belongings, d/c paperwork, and prescriptions with pt at time of d/c.

## 2017-04-13 NOTE — Discharge Instructions - Pharmacy
Physician Discharge Summary      Name: Dan Steele  Medical Record Number: [id number]        Account Number:  [id number]  Date Of Birth:  03-13-1951                         Age:  66 years   Admit date:  04/08/2017                     Discharge date: 04/12/2017 11:00 AM    Attending Physician: Dr. Benetta Spar, *               Service: Surgery-Oncology    Physician Summary completed by: Dan Los, MD    Reason for hospitalization: Rectal cancer [facility]); Rectal cancer (HCC)    Significant PMH:   Past Medical History:   Diagnosis Date   ??? Acid reflux     tums prn    ??? Adenocarcinoma (HCC)     of rectum    ??? Hypertension         Allergies: Patient has no known allergies.    Admission Physical Exam notable for:    Genitourinary Comments: External - no significant external hemorrhoids or masses DRE - no masses, no blood, good tone, slightly enlarged but smooth prostate. Rigid proctoscopy - ulcerated mass at 13cm from the anal verge on the right side     Admission Lab/Radiology studies notable for:   02/19/2017 ??? Colonoscopy:   Basics/Findings (abbreviated findings): Cecum was identified with photodocumentation of the ileocecal valve.  The bowel preparation was 7/9 on the [facility] bowel prep scale.  The patient had a large 1.5 cm pedunculated polyp with central location as well as 0.8 cm immediately adjacent pedunculated polyp 15 cm from the anal verge adjacent polyp was removed using snare polypectomy with cautery.  The middle polyp was partially removed with snare polypectomy with cautery.  Posterior third was transected and removed with biopsy forceps in piecemeal fashion with at least 6-7 bites.  Patient also had 1 cm pedunculated polyp in the mid sigmoid that was removed using snare polypectomy with cautery.  The stomach was not completely removed and second snare pass was used to completely remove the stalk.  The patient also had 2 sessile 2 x 2 mm polyps in the distal sigmoid that were completely removed using biopsy forceps.  ???  Pathology:  A. Colonic mucosa, distal sigmoid polypectomy x2: Tubular adenomas  B. Colonic mucosa, proximal rectal sigmoid polypectomy: Invasive moderately differentiated adenocarcinoma extending into the submucosa and to a cauterized edge, arising out of a tubular adenoma.  C. Colonic mucosa, distal sigmoid sessile polypectomy: Hyperplastic polyp  D. Colonic mucosa, distal sigmoid sessile polypectomy: Hyperplasic polyp   Comment: BRAF and MSI negative  ???  **Imaging:  12/05/16 - CT Abd/pelv for nausea/vomiting/RUQ pain:  1.7cm hypodense lesion in lower right posterior segment of liver, hemangioma vs cyst, 1.1cm exophytic hypodense non-enhancing lesion in mid third of right kidney  02/20/2017 ??? CT Abd/pelv:  Moderate degree of sigmoid diverticulosis. Mild colitis at the level of the splenic flexure and descending colon. 2. Moderately enlarged prostate. No bowel obstruction or free air.  02/25/2017 ??? CT Chest:  No evidence of acute cardiopulmonary abnormality. 2. Incidentally small (<15mm) noncalcified pulmonary nodules for which further evaluation with repeat follow up in is recommended.   ???  **Labs:  02/25/2017 ??? CEA ??? 1.4    03/12/17 MRI:  1. ???Short segment right lateral semiannular mass in the mid to high rectum without invasion into the mesorectal fat, consistent with the patient's primary tumor. T2 disease.  2. ???No significant pelvic lymphadenopathy.  3. ???Mild thickening and edema of the lower rectum, which may represent mild infectious or inflammatory proctitis.    Brief Hospital Course:  The patient was admitted and the following issues were addressed during this hospitalization: (with pertinent details).  66 yo M with HTN, GERD, and rectal cancer s/p robotic LAR with splenic flexure mobilization on 04/08/17. Pt tolerated the procedure well without complications. His post-op course was unremarkable and he was discharged home on POD #4 tolerating a regular diet with full return of bowel function, controlling pain with PO medications, and mobilizing at baseline. He has outpatient follow-up scheduled with Dr. Daphine Deutscher at which time his final pathology will be reviewed.     Condition at Discharge: Stable    Discharge Diagnoses:    Hospital Problems        Active Problems    Rectal cancer [facility])        Active Problems:    Rectal cancer (HCC)      Surgical Procedures:   Procedure(s) (LRB):  ROBOTIC LOW ANTERIOR RESECTION, FLEXIBLE SIGMOIDOSCOPY, (N/A)    Significant Diagnostic Studies and Procedures: noted in brief hospital course    Consults:    None    Patient Disposition: Home       Patient instructions/medications:      Activity as Tolerated    It is important to keep increasing your activity level after you leave the hospital.  Moving around can help prevent blood clots, lung infection (pneumonia) and other problems.  Gradually increasing the number of times you are up moving around will help you return to your normal activity level more quickly.  Continue to increase the number of times you are up to the chair and walking daily to return to your normal activity level. Begin to work towards your normal activity level at discharge.     Driving Restrictions    No driving while taking narcotic pain medication.     Lifting Restrictions    Do not lift more than 10 pounds for 6 weeks.     Discharge Signs/Symptoms    Please contact your doctor if you have any of the following symptoms:  *Temperature over 101 F  *Increased redness, tenderness, or swelling at the incision site  *Excessive drainage from the incision  *Drainage that smells bad  *If the edges of your incision come apart  *Severe abdominal pain with or without bloating  *Constant nausea and/or vomiting     Questions About Your Stay    For questions or concerns regarding your hospital stay:    -DURING BUSINESS HOUR (8:00 AM - 4:30 PM):  Call [phone] for Dr. Daphine Deutscher -AFTER BUSINESS HOURS (4:30 PM - 8:00 AM, on weekends, or holidays):  Call [phone] and ask the operator to page the on-call surgery physician.     Discharging attending physician: Lorelei Pont M.V. [[id number]]      Regular Diet    You have no dietary restriction. Please continue with a healthy balanced diet.     Incision Care    *Keep the incision clean.  *You may shower normally. Let warm soapy water run over it and pat dry. Do not submerge the incision in water (bath tubs, hot tubs, lakes, pools, etc) for at least 4 weeks. Do not apply ointments or creams to the  incision.   *If there is drainage from your incision cover your incision with dry gauze.  *Change the gauze twice a day.  *You have Dermabond (surgical glue) on your incisions that will gradually peel off like a scab over the course of the next few weeks.     Return Appointment    Please call Dr. Ermalene Searing office at [phone] with any questions or concerns.     [facility]  [address] [place] [address]  [phone]     [place] Provider Benetta Spar [[id number]]    Location [facility]    Appointment date: 04/30/2017    Appointment time: 2:30 PM      Opioid (Narcotic) Safety Information    OPIOID (NARCOTIC) PAIN MEDICATION SAFETY    We care about your comfort, and believe you need opioid medications at this time to treat your pain.  An opioid is a strong pain medication.  It is only available by prescription for moderate to severe pain.  Usually these medications are used for only a short time to treat pain, but sometimes will be prescribed for longer.  Talk with your doctor or nurse about how long they expect you to need this medication.    When used the right way, opioids are safe and effective medications to treat your pain, even when used for a long time.  Yet, when used in the wrong way, opioids can be dangerous for you or others.  Opioids do not work for everyone.  Most patients do not get full relief of their pain from opioid medication; full relief of your pain may not be possible.     For your safety, we ask you to follow these instructions:    *Only take your opioid medication as prescribed.  If your pain is not controlled with the prescribed dose, or the medication is not lasting long enough, call your doctor.  *Do not break or crush your opioid medication unless your doctor or pharmacist says you can.  With certain medications, this can be dangerous, and may cause death.  *Never share your medications with others, even if they appear to have a good reason.  Never take someone else's pain medication-this is dangerous, and illegal (a crime).  Overdoses and deaths have occurred.  *Keep your opioid medications safe, as you would with cash, in a lock box or similar container.  *Make sure your opioids are going to be secure, especially if you are around children or teens.  *Talk with your doctor or pharmacist before you take other medications.  *Avoid driving, operating machinery, or drinking alcohol while taking opioid pain medication.  This may be unsafe.    Pain medications can cause constipation. Constipation is bowel movements that are less often than normal. Stools often become very hard and difficult to pass. This may lead to stomach pain and bloating. It may also cause pain when trying to use the bathroom. Constipation may be treated with suppositories, laxatives or stool softeners. A diet high in fiber with plenty of fluids helps to maintain regular, soft bowel movements.      Current Discharge Medication List       START taking these medications    Details   oxyCODONE (ROXICODONE, OXY-IR) 5 mg tablet Take one tablet to two tablets by mouth every 4 hours as needed Earliest Fill Date: 04/12/17  Qty: 35 tablet, Refills: 0    PRESCRIPTION TYPE:  Print      polyethylene  glycol 3350 (MIRALAX) 17 g packet Take one packet by mouth twice daily. Qty: 12 each, Refills: 3    PRESCRIPTION TYPE:  Print  Comments: To be taken with narcotic pain medication. Hold for loose stools.          CONTINUE these medications which have NOT CHANGED    Details   aspirin EC 81 mg tablet Take 81 mg by mouth daily. Take with food.    PRESCRIPTION TYPE:  Historical Med      hydroCHLOROthiazide (HYDRODIURIL) 12.5 mg capsule Take 12.5 mg by mouth three times weekly.    PRESCRIPTION TYPE:  Historical Med      lisinopril (PRINIVIL, ZESTRIL) 40 mg tablet Take 40 mg by mouth daily.  Refills: 1    PRESCRIPTION TYPE:  Historical Med      Vit C-Vit E-Lutein-Min-OM-3 (OCUVITE) 150-30-5-150 mg-unit-mg-mg cap Take 1 capsule by mouth daily.    PRESCRIPTION TYPE:  Historical Med      vitamins, multiple tablet Take 1 tablet by mouth daily.    PRESCRIPTION TYPE:  Historical Med          The following medications were removed from your list. This list includes medications discontinued this stay and those removed from your prior med list in our system        metroNIDAZOLE (FLAGYL) 500 mg tablet        neomycin 500 mg tablet        ondansetron (ZOFRAN ODT) 8 mg rapid dissolve tablet               Future Appointments   Date Time Provider Department Center   04/30/2017  2:30 PM Benetta Spar, MD [facility] [place] Exam       Pending items needing follow up:   *final surgical pathology     Signed:  Maretta Los, MD  04/12/2017      cc:  Primary Care Physician:  Wilford Grist   Verified  Referring physicians:     Additional provider(s):

## 2017-04-16 ENCOUNTER — Encounter: Admit: 2017-04-16 | Discharge: 2017-04-16 | Payer: MEDICARE

## 2017-04-16 NOTE — Telephone Encounter
Dan Steele called and reports that he had received a few calls from the [phone] number and wanted to f/u with our office. I informed Dan Steele that I had not attempted to call him. He informs me that he is doing well and does not need anything from our office. He is still awaiting path results from his surgery.     I informed Dan Steele that his Path had come back yesterday evening. It reads: Negative more Malignancy. Pt asked, "So I don't need chemo then?" I informed Dan Steele that I cannot answer that question and will leave that to Dr. Hassell Done to discuss with him at his post operative appointment. Dan Steele states, "that's fine, if it needs to happen, just call me and we'll get that going."     No further needs.

## 2017-04-30 ENCOUNTER — Encounter: Admit: 2017-04-30 | Discharge: 2017-04-30 | Payer: MEDICARE

## 2017-04-30 DIAGNOSIS — K219 Gastro-esophageal reflux disease without esophagitis: ICD-10-CM

## 2017-04-30 DIAGNOSIS — C801 Malignant (primary) neoplasm, unspecified: ICD-10-CM

## 2017-04-30 DIAGNOSIS — I10 Essential (primary) hypertension: Principal | ICD-10-CM

## 2017-04-30 DIAGNOSIS — C20 Malignant neoplasm of rectum: Principal | ICD-10-CM

## 2017-07-30 ENCOUNTER — Encounter: Admit: 2017-07-30 | Discharge: 2017-07-30 | Payer: MEDICARE

## 2017-07-30 DIAGNOSIS — I10 Essential (primary) hypertension: Principal | ICD-10-CM

## 2017-07-30 DIAGNOSIS — C801 Malignant (primary) neoplasm, unspecified: ICD-10-CM

## 2017-07-30 DIAGNOSIS — K219 Gastro-esophageal reflux disease without esophagitis: ICD-10-CM

## 2018-04-01 ENCOUNTER — Encounter: Admit: 2018-04-01 | Discharge: 2018-04-01 | Payer: MEDICARE

## 2018-04-01 DIAGNOSIS — K219 Gastro-esophageal reflux disease without esophagitis: ICD-10-CM

## 2018-04-01 DIAGNOSIS — C20 Malignant neoplasm of rectum: Principal | ICD-10-CM

## 2018-04-01 DIAGNOSIS — C801 Malignant (primary) neoplasm, unspecified: ICD-10-CM

## 2018-04-01 DIAGNOSIS — I10 Essential (primary) hypertension: Principal | ICD-10-CM

## 2018-04-01 LAB — COMPREHENSIVE METABOLIC PANEL
Lab: 0.8 mg/dL (ref 0.4–1.24)
Lab: 1.4 mg/dL — ABNORMAL HIGH (ref 0.3–1.2)
Lab: 10 mg/dL (ref 7–25)
Lab: 103 MMOL/L (ref 98–110)
Lab: 139 MMOL/L (ref 137–147)
Lab: 17 U/L (ref 7–40)
Lab: 19 U/L (ref 7–56)
Lab: 29 MMOL/L (ref 21–30)
Lab: 3.8 MMOL/L (ref 3.5–5.1)
Lab: 4.7 g/dL (ref 3.5–5.0)
Lab: 57 U/L (ref 25–110)
Lab: 7.6 g/dL (ref 6.0–8.0)
Lab: 9.6 mg/dL (ref 8.5–10.6)
Lab: >60 mL/min (ref >60)
Lab: >60 mL/min (ref >60)
Lab: 7 (ref 3–12)

## 2018-04-01 LAB — CBC
Lab: 15 g/dL (ref 13.5–16.5)
Lab: 34 pg — ABNORMAL HIGH (ref 26–34)
Lab: 4.6 M/UL (ref 4.4–5.5)
Lab: 45 % (ref 40–50)
Lab: 6.7 10*3/uL (ref 4.5–11.0)

## 2018-04-01 LAB — CEA(CARCINOEMBRYONIC AG): Lab: 1.2 ng/mL (ref <3.0)

## 2018-04-01 MED ORDER — CEFOXITIN 2G/100ML NS IVPB (MB+) (OR ONLY)
2 g | Freq: Once | INTRAVENOUS | 0 refills | Status: CN
Start: 2018-04-01 — End: ?

## 2018-04-01 MED ORDER — SODIUM CHLORIDE 0.9 % IV SOLP
250 mL | INTRAVENOUS | 0 refills | Status: CN
Start: 2018-04-01 — End: ?

## 2018-04-30 ENCOUNTER — Encounter: Admit: 2018-04-30 | Discharge: 2018-04-30 | Payer: MEDICARE

## 2018-04-30 DIAGNOSIS — I10 Essential (primary) hypertension: Secondary | ICD-10-CM

## 2018-04-30 DIAGNOSIS — C801 Malignant (primary) neoplasm, unspecified: Secondary | ICD-10-CM

## 2018-04-30 DIAGNOSIS — K219 Gastro-esophageal reflux disease without esophagitis: Secondary | ICD-10-CM

## 2018-05-04 ENCOUNTER — Encounter: Admit: 2018-05-04 | Discharge: 2018-05-04 | Payer: MEDICARE

## 2018-05-04 ENCOUNTER — Encounter: Admit: 2018-05-04 | Discharge: 2018-05-05 | Payer: MEDICARE

## 2018-05-04 ENCOUNTER — Ambulatory Visit: Admit: 2018-05-04 | Discharge: 2018-05-04 | Payer: MEDICARE

## 2018-05-04 DIAGNOSIS — C20 Malignant neoplasm of rectum: Secondary | ICD-10-CM

## 2018-05-04 DIAGNOSIS — I10 Essential (primary) hypertension: Secondary | ICD-10-CM

## 2018-05-04 DIAGNOSIS — K219 Gastro-esophageal reflux disease without esophagitis: Secondary | ICD-10-CM

## 2018-05-04 DIAGNOSIS — C801 Malignant (primary) neoplasm, unspecified: Secondary | ICD-10-CM

## 2018-05-04 MED ORDER — OXYCODONE 5 MG PO TAB
5-10 mg | Freq: Once | ORAL | 0 refills | Status: CN | PRN
Start: 2018-05-04 — End: ?

## 2018-05-04 MED ORDER — LIDOCAINE (PF) 10 MG/ML (1 %) IJ SOLN
.1-2 mL | INTRAMUSCULAR | 0 refills | Status: DC | PRN
Start: 2018-05-04 — End: 2018-05-09

## 2018-05-04 MED ORDER — ONDANSETRON HCL (PF) 4 MG/2 ML IJ SOLN
4 mg | Freq: Once | INTRAVENOUS | 0 refills | Status: CN | PRN
Start: 2018-05-04 — End: ?

## 2018-05-04 MED ORDER — SODIUM CHLORIDE 0.9 % IV SOLP
250 mL | INTRAVENOUS | 0 refills | Status: DC
Start: 2018-05-04 — End: 2018-05-09

## 2018-05-04 MED ORDER — LACTATED RINGERS IV SOLP
1000 mL | INTRAVENOUS | 0 refills | Status: DC
Start: 2018-05-04 — End: 2018-05-09

## 2018-05-04 MED ORDER — PROPOFOL INJ 10 MG/ML IV VIAL
0 refills | Status: DC
Start: 2018-05-04 — End: 2018-05-04

## 2018-05-04 MED ORDER — PROPOFOL 10 MG/ML IV EMUL 50 ML (INFUSION)(AM)(OR)
INTRAVENOUS | 0 refills | Status: DC
Start: 2018-05-04 — End: 2018-05-04

## 2018-05-04 MED ORDER — CEFOXITIN 2G/100ML NS IVPB (MB+) (OR ONLY)
2 g | Freq: Once | INTRAVENOUS | 0 refills | Status: DC
Start: 2018-05-04 — End: 2018-05-09

## 2018-05-04 MED ORDER — SIMETHICONE 40 MG/0.6 ML PO DRPS
0 refills | Status: DC
Start: 2018-05-04 — End: 2018-05-09

## 2018-05-04 MED ORDER — FENTANYL CITRATE (PF) 50 MCG/ML IJ SOLN
25-50 ug | INTRAVENOUS | 0 refills | Status: CN | PRN
Start: 2018-05-04 — End: ?

## 2018-05-05 ENCOUNTER — Encounter: Admit: 2018-05-05 | Discharge: 2018-05-05 | Payer: MEDICARE

## 2018-05-05 DIAGNOSIS — C801 Malignant (primary) neoplasm, unspecified: Secondary | ICD-10-CM

## 2018-05-05 DIAGNOSIS — K219 Gastro-esophageal reflux disease without esophagitis: Secondary | ICD-10-CM

## 2018-05-05 DIAGNOSIS — I10 Essential (primary) hypertension: Secondary | ICD-10-CM

## 2018-05-24 IMAGING — US [PERSON_NAME]
1 series · 14 of 16 positions shown · non-contrast
Comparison: none

Diagnostic Study

EXAM
Lower extremity nonvascular ultrasound.
INDICATION
Palpable mass posterior-lateral right upper thigh. Nonpainful, no injury.
FINDINGS
Ultrasound demonstrates a 2.33 x 2.01 x 1.2 centimeter solid mass. It is heterogeneous. The margins
are generally well delineated. There is some peripheral blood flow.
This is not a cyst. A fluid component is in not appreciated.

[Series 1: us low ext right · 28 acquisitions, 14 frames shown]
[im 1/28]
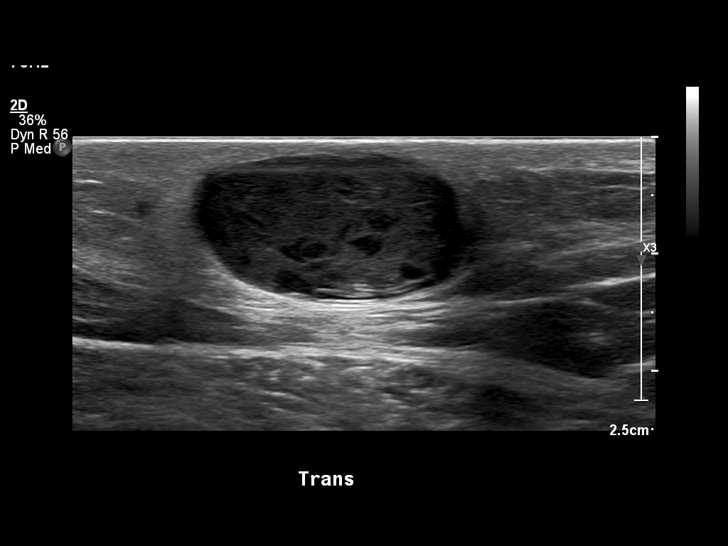
[im 2/28]
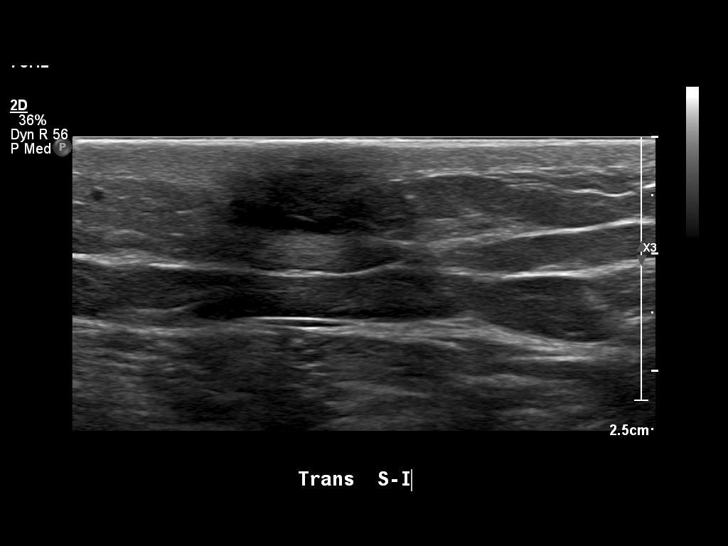
[im 4/28]
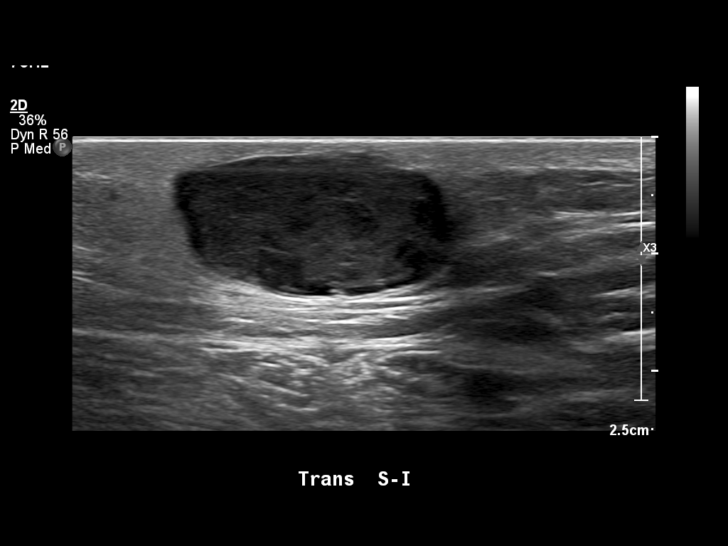
[im 8/28]
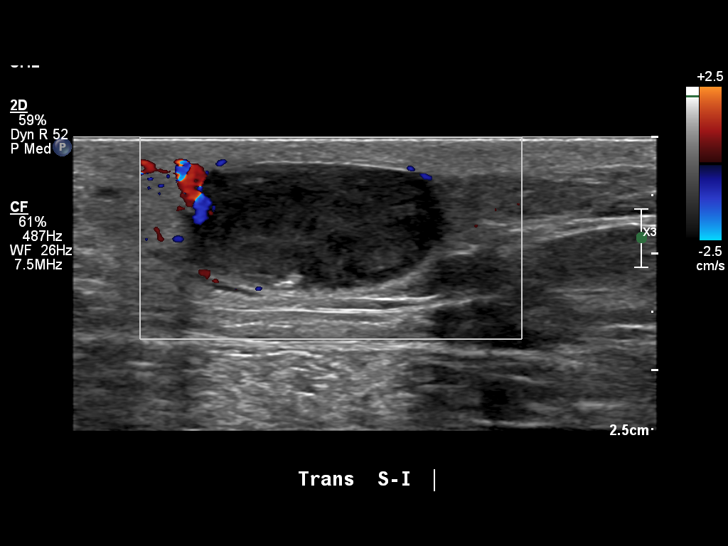
[im 10/28]
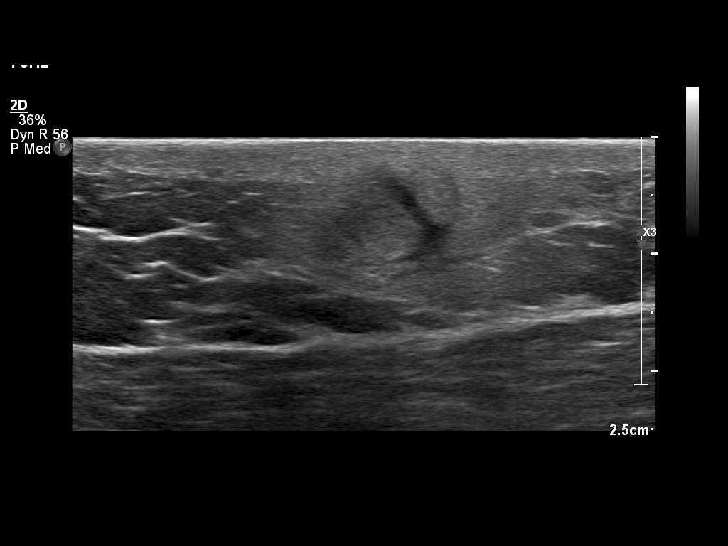
[im 11/28]
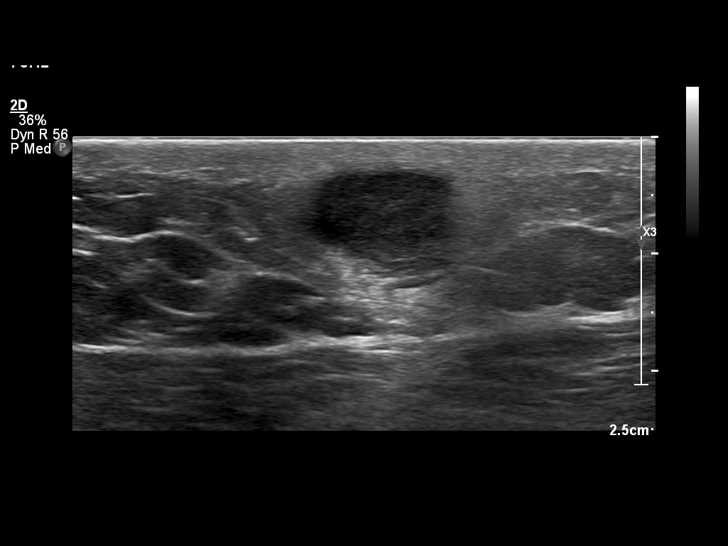
[im 13/28]
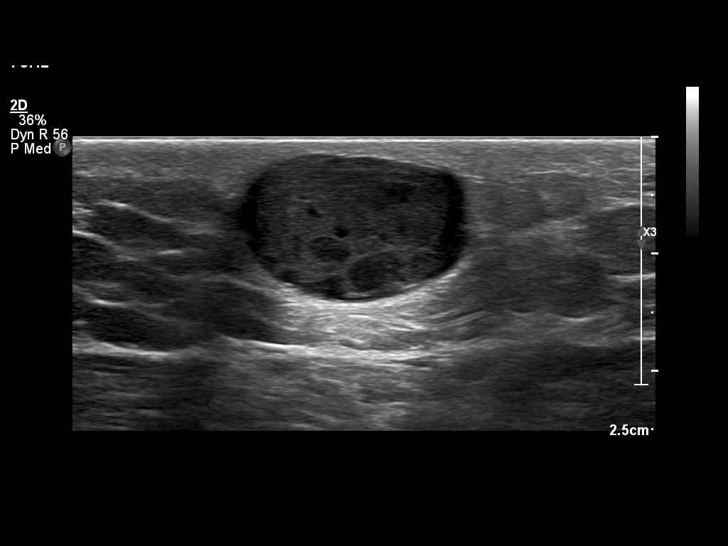
[im 15/28]
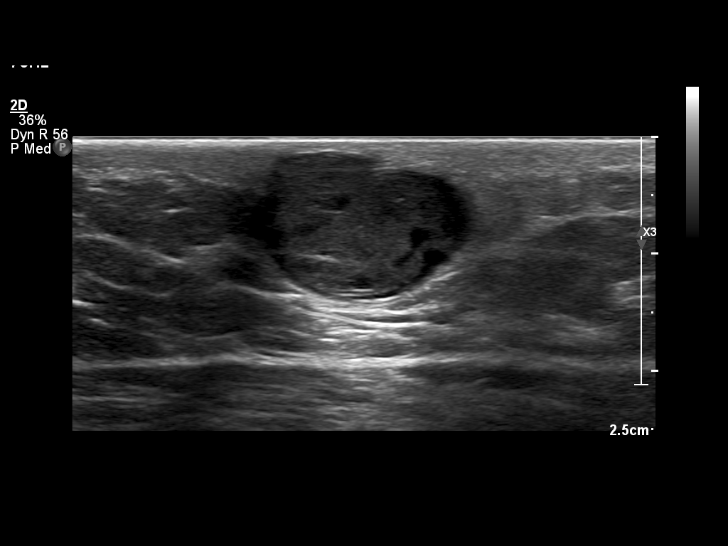
[im 17/28]
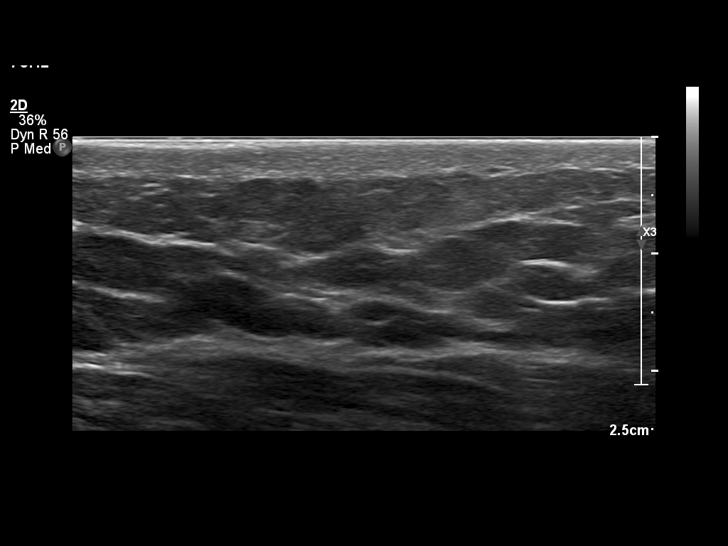
[im 19/28]
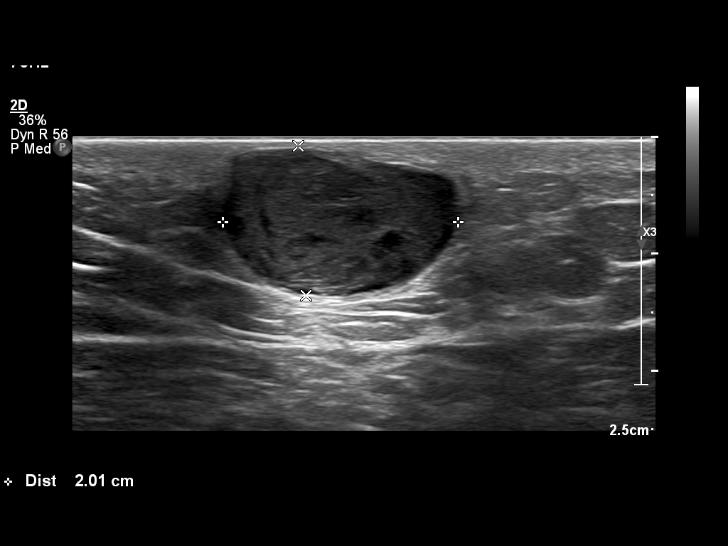
[im 22/28]
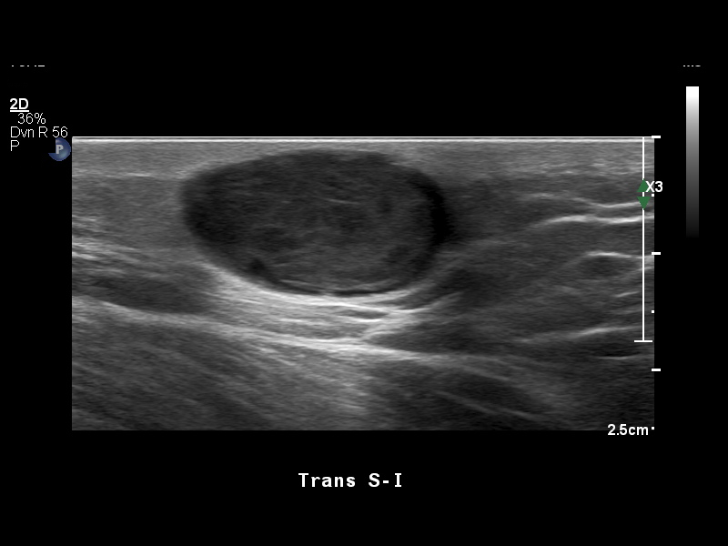
[im 24/28]
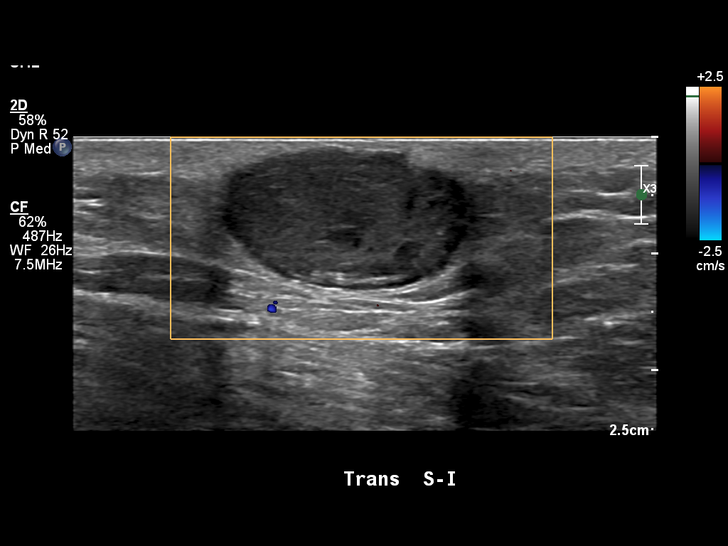
[im 26/28]
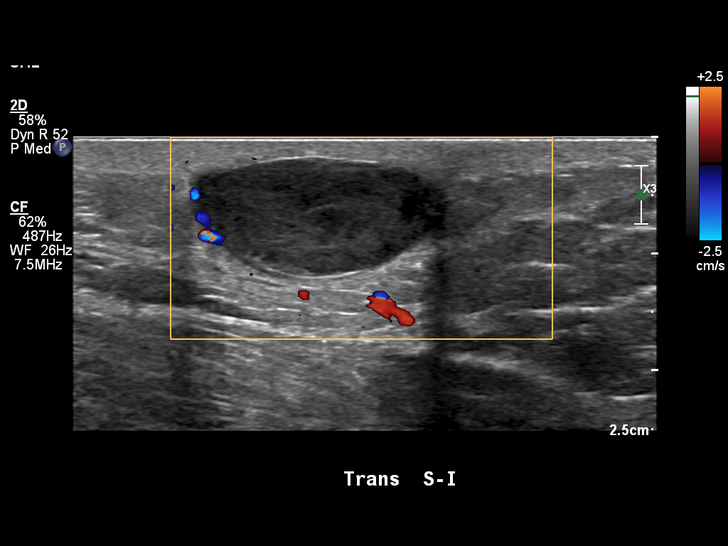
[im 28/28]
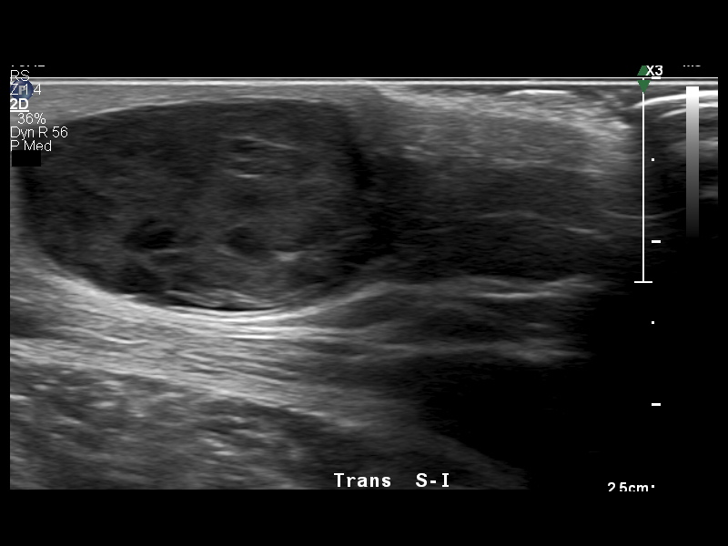

[14 of 16 positions shown; findings below may reference images not displayed]

IMPRESSION: The palpable abnormality is a heterogeneous solid mass with peripheral blood flow. Percutaneous
sampling can be performed if clinically indicated.

Tech Notes:

PALPABLE AREA ON POSTERIOR/LATERAL RIGHT UPPER THIGH X 4 DAYS, NO INJURY, NO PAIN, NO DRAINAGE

## 2018-09-30 ENCOUNTER — Encounter: Admit: 2018-09-30 | Discharge: 2018-09-30

## 2018-09-30 DIAGNOSIS — I10 Essential (primary) hypertension: Secondary | ICD-10-CM

## 2018-09-30 DIAGNOSIS — C801 Malignant (primary) neoplasm, unspecified: Secondary | ICD-10-CM

## 2018-09-30 DIAGNOSIS — K219 Gastro-esophageal reflux disease without esophagitis: Secondary | ICD-10-CM

## 2018-09-30 DIAGNOSIS — C20 Malignant neoplasm of rectum: Principal | ICD-10-CM

## 2018-09-30 DIAGNOSIS — Z87891 Personal history of nicotine dependence: Secondary | ICD-10-CM

## 2018-09-30 NOTE — Progress Notes
Name: Dan Steele          MRN: [id number]      DOB: Jan 14, 1951      AGE: 68 y.o.   DATE OF SERVICE: 09/30/2018    Subjective:          Reason for Visit:  Followup - Rectal Cancer    History of Present Illness  Mr. Dan Steele is a 68 year old male with a history of rectal cancer.  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  Post-operatively, he had LAR syndrome symptoms including frequency, clustering and urgency. Since his last visit, he has been taking regular fiber which has helped in bulking up his stool but has had changed his frequency. He continues to have clustering symptoms, about three a day where he has multiple small bowel movements 30 minutes apart with incomplete emptying followed by a few hours of no bowel movements (8-12 per day). His bowel movements are soft, denies blood. He does not have much skin irritation but will use a barrier cream every once in awhile. He has tried using imodium if he plans on going out and has had good success with controlling the frequency. He does not use it frequently as the clustering is not interfering with his life style. He denies abdominal pain or weight loss.  ???  12/05/2016 - CT Abd/pelv for nausea/vomiting/RUQ pain:  1.7cm hypodense lesion in lower right posterior segment of liver, hemangioma vs cyst, 1.1cm exophytic hypodense non-enhancing lesion in mid third of right kidney  ???  02/19/2017 ??? Colonoscopy:???  Basics/Findings (abbreviated findings):???Cecum was identified with photodocumentation of the ileocecal valve. ???The bowel preparation was 7/9 on the [facility] bowel prep scale. ???The patient had a large 1.5 cm pedunculated polyp with central location as well as 0.8 cm immediately adjacent pedunculated polyp 15 cm from the anal verge adjacent polyp was removed using snare polypectomy with cautery. ???The middle polyp was partially removed with snare polypectomy with cautery. ???Posterior third was transected and removed with biopsy forceps in piecemeal fashion with at least 6-7 bites. ???Patient also had 1 cm pedunculated polyp in the mid sigmoid that was removed using snare polypectomy with cautery. ???The stomach was not completely removed and second snare pass was used to completely remove the stalk. ???The patient also had 2 sessile 2 x 2 mm polyps in the distal sigmoid that were completely removed using biopsy forceps.  ???  Pathology:  A. Colonic mucosa, distal sigmoid polypectomy x2: Tubular adenomas  B. Colonic mucosa, proximal rectal sigmoid polypectomy: Invasive moderately differentiated adenocarcinoma extending into the submucosa and to a cauterized edge, arising out of a tubular adenoma.  C. Colonic mucosa, distal sigmoid sessile polypectomy: Hyperplastic polyp  D. Colonic mucosa, distal sigmoid sessile polypectomy: Hyperplasic polyp   Comment: BRAF and MSI negative  ???  [place] OVERREAD:  A. Outside case [id number] (Date collected: 1-/25/18):   1. Colonic mucosa, distal sigmoid polypectomy x 2, biopsy:   Tubular adenomas.   ??? ???   2. Colonic mucosa, proximal rectal sigmoid polypectomy, biopsy:   Superficial fragments of moderately differentiated   adenocarcinoma, arising in a tubular adenoma.     3. Colonic mucosa, distal sigmoid sessile polypectomy, biopsy:   Hyperplastic polyp.   4. Colonic mucosa, distal sigmoid sessile polypectomy, biopsy:   Focal hyperplastic change.   ???  02/20/2017 ??? CT Abd/pelv:  Moderate degree of sigmoid diverticulosis. Mild colitis at the level of the splenic flexure  and descending colon. 2. Moderately enlarged prostate. No bowel obstruction or free air.  ???  02/25/2017 ??? CT Chest:  No evidence of acute cardiopulmonary abnormality. 2. Incidentally small???(<28mm)???noncalcified pulmonary nodules for which further evaluation with repeat follow up in is recommended.      02/25/2017 ??? CEA?????? 1.4   ???  03/12/2017 - MRI Pelv: 1. ???Short segment right lateral semiannular mass in the mid to high rectum   without invasion into the mesorectal fat, consistent with the patient's   primary tumor. T2 disease.    2. ???No significant pelvic lymphadenopathy.    3. ???Mild thickening and edema of the lower rectum, which may represent   mild infectious or inflammatory proctitis.  ???  04/08/2017 - 1) Robotic low anterior resection 2) Splenic flexure mobilization 3) Flexible sigmoidoscopy  Findings:??????Tumor located at 13cm, 29 EEA anastomosis at 10cm from the anal verge, 3cm gross distal margin after specimen inspected on the back table, negative leak test with intact doughnuts.  Pathology:  A. Colon and lymph nodes (48), rectum and sigmoid, resection:   Moderately differentiated adenocarcinoma, extending to muscularis   propria.   Margins negative for malignancy.   Negative for malignancy in forty-eight lymph nodes (0/48). ???     B. Colon and lymph node (1), distal donut, excision:   No diagnostic abnormalities. ???   Negative for malignancy in one lymph node (0/1).     Comment:   COLON AND RECTUM: Resection, Including Transanal Disk Excision of Rectal   Neoplasms   CAP Version: ColonRectum 4.0.0.1     Specimen:   Sigmoid colon   Rectum   Procedure:   Low anterior resection   Specimen Length: 38.5 cm   Tumor Site:   Rectum   Tumor Location:   Tumor is located entirely below the peritoneal reflection   Tumor Size:   Greatest dimension: 1.2 cm   Macroscopic Tumor Perforation:   Not identified   Macroscopic Intactness of Mesorectum: (ONLY FOR CARCINOMAS INVOLVING THE   RECTUM)   Near complete   Histologic Type:   Adenocarcinoma   Histologic Grade:   G2: ???Moderately differentiated   Microscopic Tumor Extension:   Tumor invades muscularis propria   Margins:   All margins uninvolved by invasive carcinoma, high-grade dysplasia,   intramucosal adenocarcinoma, and   adenoma   Margins examined: Proximal, distal, radial Distance of invasive carcinoma from closest margin: 0.9 cm   Specify margin: Radial   Treatment Effect (applicable to carcinomas treated with neoadjuvant   therapy):   No known prior treatment   Lymph-Vascular Invasion:   Not identified   Perineural Invasion:   Not identified   Tumor Budding:   Number of tumor buds in 1 hotspot field:   Low score (0-4)   Type of Polyp in Which Invasive Carcinoma Arose: ???   None identified   Tumor Deposits:   Not identified   Lymph nodes:   Number of lymph nodes examined: 49   Number of lymph nodes involved: 0   Prognostic markers ordered: See Tumor Prognostic Marker addendum in   Results Review. The results of a CDX2 immunostain for stage II carcinomas   (stage II = T3/T4a/T4b N0 M0) will be reported in an addendum.   Pathologic Stage Classification (pTNM, AJCC 8th Edition)   Pathologic Staging (pTNM): pT2 N0   Primary Tumor (pT):   pT2: ???Tumor invades muscularis propria   Regional Lymph Nodes (pN):   pN0: ???No regional lymph node metastasis  The pathologic stage assigned here should be regarded as provisional, as   it reflects only current pathologic data and does not incorporate full   knowledge of the patient's clinical status and/or prior pathology.     Block for Biomarker Testing: [A6]   ???  i. Immunohistochemistry (IHC) Testing for Mismatch Repair (MMR) Proteins ???   MLH1   Intact nuclear expression   MSH2   Intact nuclear expression   MSH6   Intact nuclear expression   PMS2   Intact nuclear expression   Background nonneoplastic tissue/internal control with intact nuclear   expression: yes     IHC Interpretation   No loss of nuclear expression of MMR proteins: low probability of   microsatellite instability-high (MSI-H)#   ???  04/01/2018 - CEA - 1.2    05/04/2018 - Colonoscopy  Perianal skin was macerated. DRE was normal with no palpable masses or nodules  There was evidence of prior end-to-end colo-colonic anastomosis in the mid rectum at 11cm from the anal verge. This was patent and was characterized by healthy appearing mucosa. The anastomosis was tranversed. Remainder of the exam to the terminal ileum was normal. The entire examined colon appeared normal on direct and retroflexion views. Random cold forcep biopsies  were taken to rule out  microscopic colitis in the right and left colon.     Pathology:  Final Diagnosis:     A. Colonic mucosa, right colon random biopsies r/o microscopic colitis,   biopsy:   Colonic mucosa with no diagnostic abnormalities.     B. Colonic mucosa, left colon random biopsies r/o microscopic colitis,   biopsy:   Colonic mucosa with no diagnostic abnormalities.     Medical History:   Diagnosis Date   ??? Acid reflux     tums prn    ??? Adenocarcinoma (HCC)     of rectum    ??? Hypertension      Surgical History:   Procedure Laterality Date   ??? ROBOTIC LOW ANTERIOR RESECTION, FLEXIBLE SIGMOIDOSCOPY, N/A 04/08/2017    Performed by Benetta Spar, MD at [facility] OR   ??? COLONOSCOPY WITH BIOPSY - FLEXIBLE - POSSIBLE POLYPECTOMY N/A 05/04/2018    Performed by Benetta Spar, MD at [facility] OR   ??? COLONOSCOPY       Family History   Problem Relation Age of Onset   ??? Cancer-Breast Mother    ??? Diabetes Mother    ??? Cancer Father    ??? Diabetes Father      Social History     Socioeconomic History   ??? Marital status: Single     Spouse name: Not on file   ??? Number of children: Not on file   ??? Years of education: Not on file   ??? Highest education level: Not on file   Occupational History   ??? Occupation: Retired Art therapist   Tobacco Use   ??? Smoking status: Former Smoker     Packs/day: 0.50     Years: 10.00     Pack years: 5.00     Types: Cigarettes     Last attempt to quit: 1988     Years since quitting: 32.4   ??? Smokeless tobacco: Never Used   Substance and Sexual Activity   ??? Alcohol use: Yes     Alcohol/week: 14.0 standard drinks     Types: 14 Cans of beer per week   ??? Drug use: No   ??? Sexual activity: Not on file Other  Topics Concern   ??? Not on file   Social History Narrative   ??? Not on file        Review of Systems   Constitutional: Negative.    HENT: Negative.    Eyes: Negative.    Respiratory: Negative.    Cardiovascular: Negative.    Gastrointestinal: Negative.    Endocrine: Negative.    Genitourinary: Negative.    Musculoskeletal: Negative.    Skin: Negative.    Allergic/Immunologic: Negative.    Neurological: Negative.    Hematological: Negative.    Psychiatric/Behavioral: Negative.    All other systems reviewed and are negative.    Objective:         ??? aspirin EC 81 mg tablet Take 81 mg by mouth daily. Take with food.   ??? hydroCHLOROthiazide (HYDRODIURIL) 12.5 mg capsule Take 12.5 mg by mouth three times weekly.   ??? lisinopril (PRINIVIL, ZESTRIL) 40 mg tablet Take 40 mg by mouth daily.   ??? Vit C-Vit E-Lutein-Min-OM-3 (OCUVITE) 150-30-5-150 mg-unit-mg-mg cap Take 1 capsule by mouth daily.   ??? vitamins, multiple tablet Take 1 tablet by mouth daily.     Vitals:    09/30/18 0824   BP: (!) 162/79   BP Source: Arm, Left Upper   Patient Position: Sitting   Pulse: 85   Resp: 16   Temp: 36.3 ???C (97.3 ???F)   SpO2: 97%   Weight: 104.3 kg (230 lb)   Height: 177.8 cm (70)   PainSc: Zero     Body mass index is 33 kg/m???.     Pain Score: Zero     Fatigue Scale: 0-None    Pain Addressed:  N/A    Patient Evaluated for a Clinical Trial: No treatment clinical trial available for this patient.     [place] Cooperative Oncology Group performance status is 0, Fully active, able to carry on all pre-disease performance without restriction.Marland Kitchen     Physical Exam  Vitals signs and nursing note reviewed.   Constitutional:       General: He is not in acute distress.     Appearance: Normal appearance. He is normal weight.   HENT:      Head: Normocephalic.      Mouth/Throat:      Mouth: Mucous membranes are moist.   Eyes:      Extraocular Movements: Extraocular movements intact.   Neck:      Musculoskeletal: Normal range of motion.   Cardiovascular: Rate and Rhythm: Normal rate and regular rhythm.      Pulses: Normal pulses.   Pulmonary:      Effort: Pulmonary effort is normal.   Abdominal:      General: Abdomen is flat. There is no distension.      Palpations: There is no mass.      Tenderness: There is no abdominal tenderness. There is no guarding.      Hernia: No hernia is present.      Comments: well healed incisions with no evidence of hernia   Musculoskeletal: Normal range of motion.      Right lower leg: No edema.      Left lower leg: No edema.   Skin:     General: Skin is warm.   Neurological:      General: No focal deficit present.      Mental Status: He is alert and oriented to person, place, and time.   Psychiatric:         Mood and Affect: Mood  normal.         Thought Content: Thought content normal.     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 Symptoms include increased stool frequency, clustering, fecal incontinence, and urgency, fragmentated stool, feeling of incomplete emptying, and perianal skin irritation. This can lead to increased frustration and decreased quality of life.  - Continue fiber supplementation as discussed before  - May start staking half a tablet of Imodium to continue frequency. Patient said that he will take as needed but at this time, he does not think he needs it on a regular basis. Continue to use barrier cream PRN if develop skin irritation.   - Repeat colonoscopy in 2023 (three years after last)  - No evidence of recurrent disease  - RTC in 6 months with repeat labs    Trudie Reed, MD    Deedra Ehrich, MD  Colon and Rectal Surgery  Pager: [phone]

## 2019-03-16 ENCOUNTER — Encounter: Admit: 2019-03-16 | Discharge: 2019-03-16 | Payer: MEDICARE

## 2019-03-16 DIAGNOSIS — C20 Malignant neoplasm of rectum: Secondary | ICD-10-CM

## 2019-03-17 ENCOUNTER — Encounter: Admit: 2019-03-17 | Discharge: 2019-03-17 | Payer: MEDICARE

## 2019-03-17 NOTE — Telephone Encounter
-----   Message from Francee Piccolo, RN sent at 03/16/2019  4:25 PM CST -----  Regarding: Please Schedule  Diagnosis code (ICD10): rectal cancer            Ordering Doctor/APP: martin    Can it be scheduled with APP: No    Nurse draw or phlebotomy draw: Phlebotomy Draw    Imaging:   Before    Follow up appointment:  MD-Return:     Has the patient been contacted about this appointment? No    Comments for appointment: Please call patient and schedule a lab appointment prior to his appt with DR. Hassell Done on 12/1.   Thank you,   Delynn Flavin, RN

## 2019-03-17 NOTE — Telephone Encounter
Spoke with Halina Maidens verbalized that he would like to get his labs scheduled closer to home.

## 2019-03-17 NOTE — Telephone Encounter
Dan Steele left a message stating that he would like to complete his labs with his PCP before his visit with Dr. Hassell Done on 12/1. I called Dr. Laury Deep office to obtain the fax number: [phone]. I faxed the lab orders x 3 and received a fax confirmation.    I called Dan Steele to let him know that we faxed the orders to Dr. Laury Deep office. He verbalized understanding.

## 2019-03-29 ENCOUNTER — Encounter: Admit: 2019-03-29 | Discharge: 2019-03-29 | Payer: MEDICARE

## 2019-03-29 DIAGNOSIS — I10 Essential (primary) hypertension: Secondary | ICD-10-CM

## 2019-03-29 DIAGNOSIS — C801 Malignant (primary) neoplasm, unspecified: Secondary | ICD-10-CM

## 2019-03-29 DIAGNOSIS — K219 Gastro-esophageal reflux disease without esophagitis: Secondary | ICD-10-CM

## 2019-04-26 ENCOUNTER — Encounter: Admit: 2019-04-26 | Discharge: 2019-04-26 | Payer: MEDICARE

## 2019-04-26 NOTE — Telephone Encounter
Servando Salina called and wanted to update his phone number to [phone]. Chart changed to reflect this new #.

## 2019-08-03 ENCOUNTER — Encounter: Admit: 2019-08-03 | Discharge: 2019-08-03 | Payer: MEDICARE

## 2019-08-03 NOTE — Telephone Encounter
Spoke with Dan Steele verbalized understanding of appointment on 09/27/2019 lab at 8:45am with 8:30am arrival time at [facility] location.

## 2019-08-03 NOTE — Telephone Encounter
-----   Message from Rema Fendt, RN sent at 08/03/2019  1:04 PM CDT -----  Regarding: add lab apt  Diagnosis code (ICD10): rectal cancer     Ordering Doctor/APP: Hassell Done    Can it be scheduled with APP: Yes    Nurse draw or phlebotomy draw: Phlebotomy Draw    Imaging:   N/A    Follow up appointment:  MID-LEVEL VISIT:     Has the patient been contacted about this appointment? Yes    Comments for appointment: please add lab appointment prior the [facility] apt.   Thank you

## 2019-09-27 ENCOUNTER — Encounter: Admit: 2019-09-27 | Discharge: 2019-09-27 | Payer: MEDICARE

## 2019-09-27 DIAGNOSIS — C801 Malignant (primary) neoplasm, unspecified: Secondary | ICD-10-CM

## 2019-09-27 DIAGNOSIS — I10 Essential (primary) hypertension: Secondary | ICD-10-CM

## 2019-09-27 DIAGNOSIS — C20 Malignant neoplasm of rectum: Secondary | ICD-10-CM

## 2019-09-27 DIAGNOSIS — R198 Other specified symptoms and signs involving the digestive system and abdomen: Secondary | ICD-10-CM

## 2019-09-27 DIAGNOSIS — K219 Gastro-esophageal reflux disease without esophagitis: Secondary | ICD-10-CM

## 2019-09-27 LAB — COMPREHENSIVE METABOLIC PANEL
Lab: 0.8 mg/dL (ref 0.4–1.24)
Lab: 10 mg/dL (ref 7–25)
Lab: 101 MMOL/L — ABNORMAL HIGH (ref 98–110)
Lab: 127 mg/dL — ABNORMAL HIGH (ref 70–100)
Lab: 139 MMOL/L (ref 137–147)
Lab: 4 MMOL/L — ABNORMAL HIGH (ref 3.5–5.1)
Lab: 9.4 mg/dL (ref 8.5–10.6)

## 2019-09-27 LAB — CBC
Lab: 4.4 M/UL (ref 4.4–5.5)
Lab: 43 % (ref 40–50)
Lab: 6.3 10*3/uL (ref 4.5–11.0)

## 2019-09-27 LAB — CEA(CARCINOEMBRYONIC AG): Lab: 1.2 ng/mL (ref <3.0)

## 2019-09-27 MED ORDER — LOPERAMIDE 2 MG PO CAP
ORAL_CAPSULE | ORAL | 0 refills | 10.00000 days | Status: AC
Start: 2019-09-27 — End: ?

## 2019-11-25 ENCOUNTER — Encounter: Admit: 2019-11-25 | Discharge: 2019-11-25 | Payer: MEDICARE

## 2020-04-03 ENCOUNTER — Encounter: Admit: 2020-04-03 | Discharge: 2020-04-03 | Payer: MEDICARE

## 2020-04-03 DIAGNOSIS — K219 Gastro-esophageal reflux disease without esophagitis: Secondary | ICD-10-CM

## 2020-04-03 DIAGNOSIS — C20 Malignant neoplasm of rectum: Secondary | ICD-10-CM

## 2020-04-03 DIAGNOSIS — C801 Malignant (primary) neoplasm, unspecified: Secondary | ICD-10-CM

## 2020-04-03 DIAGNOSIS — I10 Essential (primary) hypertension: Secondary | ICD-10-CM

## 2020-04-05 ENCOUNTER — Encounter: Admit: 2020-04-05 | Discharge: 2020-04-05 | Payer: MEDICARE

## 2020-04-05 DIAGNOSIS — C20 Malignant neoplasm of rectum: Secondary | ICD-10-CM

## 2020-04-11 ENCOUNTER — Encounter: Admit: 2020-04-11 | Discharge: 2020-04-11 | Payer: MEDICARE

## 2020-04-11 DIAGNOSIS — C20 Malignant neoplasm of rectum: Secondary | ICD-10-CM

## 2020-04-12 ENCOUNTER — Encounter: Admit: 2020-04-12 | Discharge: 2020-04-12 | Payer: MEDICARE

## 2020-04-12 DIAGNOSIS — C20 Malignant neoplasm of rectum: Secondary | ICD-10-CM

## 2020-05-15 ENCOUNTER — Encounter: Admit: 2020-05-15 | Discharge: 2020-05-15 | Payer: MEDICARE

## 2020-08-03 IMAGING — US CHEST
1 series · 14 of 16 positions shown · non-contrast
Comparison: none

[Series 1: us chest, soft tissue · 33 acquisitions, 14 frames shown]
[im 1/33]
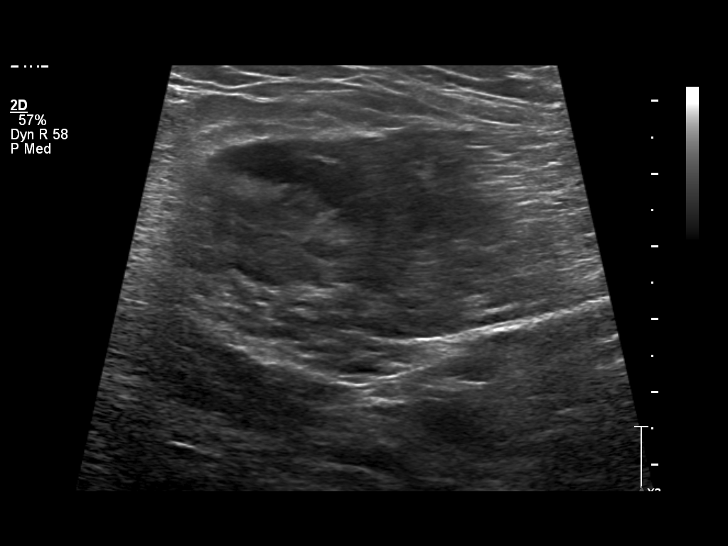
[im 3/33]
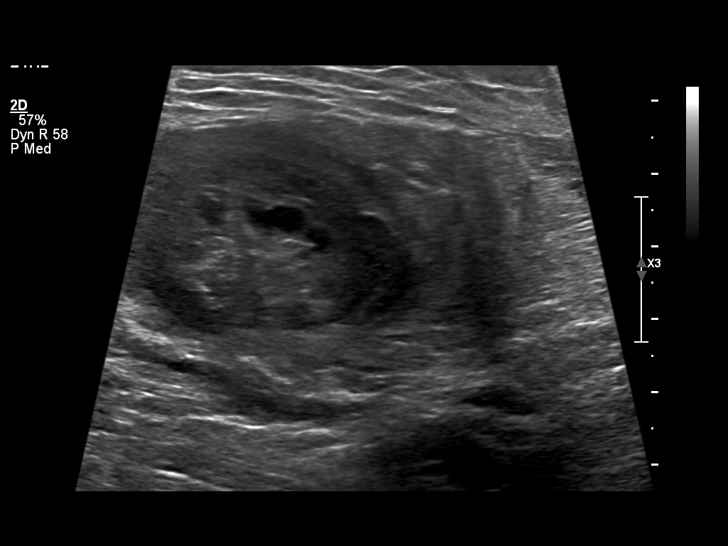
[im 5/33]
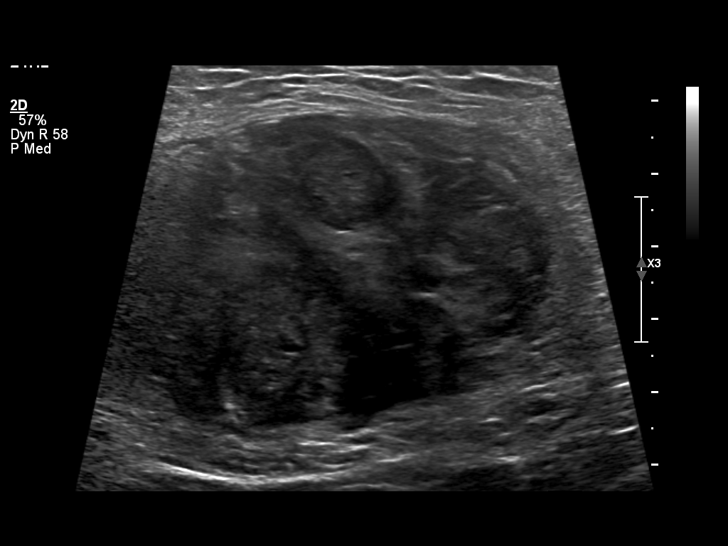
[im 9/33]
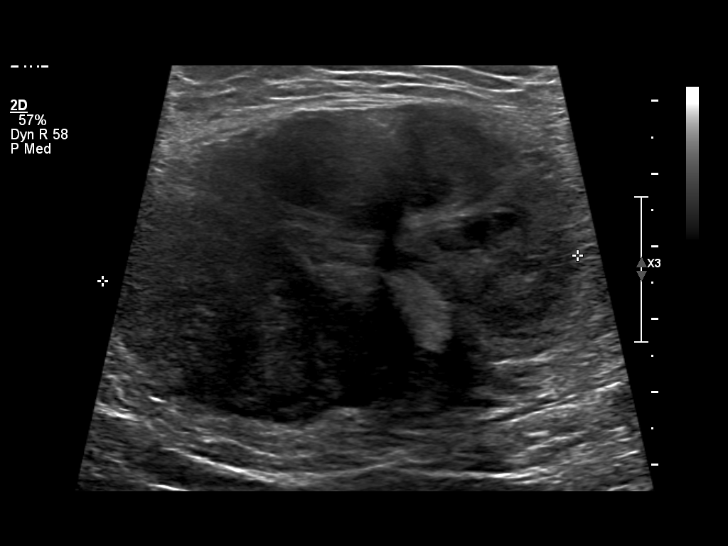
[im 11/33]
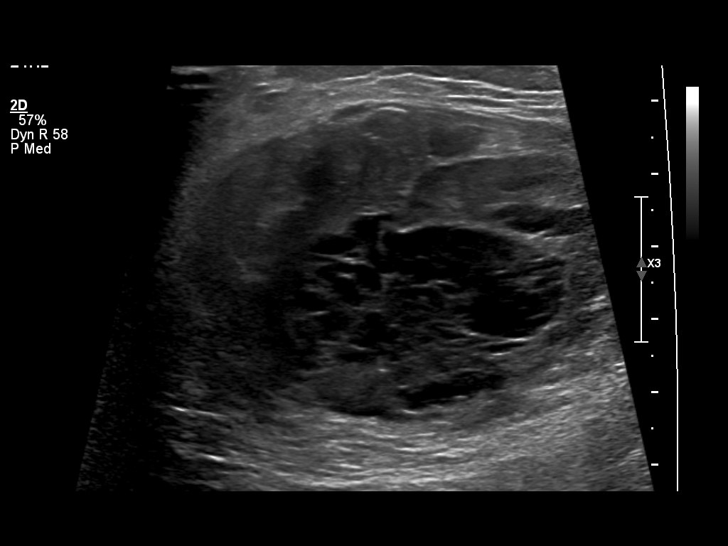
[im 13/33]
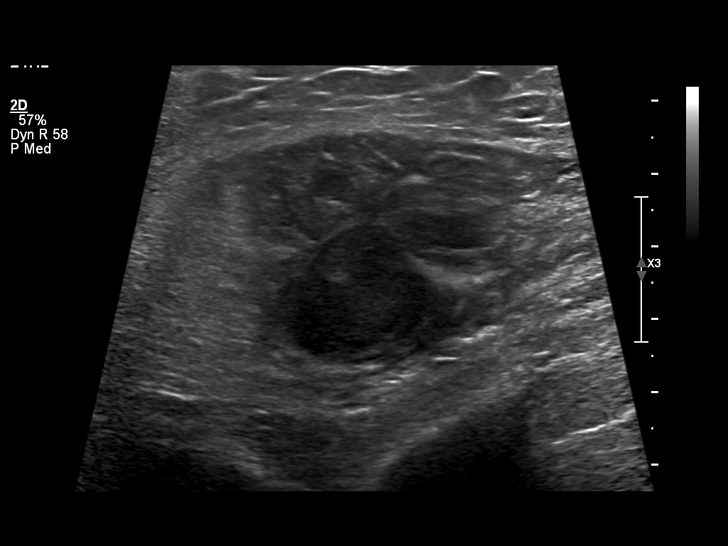
[im 15/33]
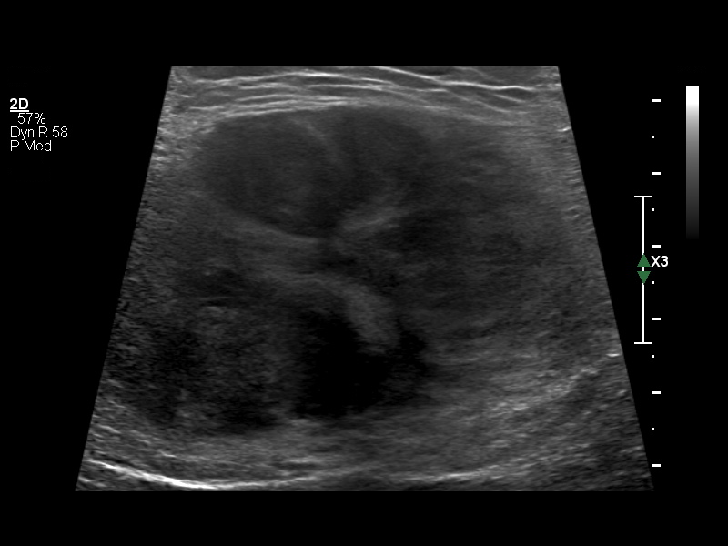
[im 18/33]
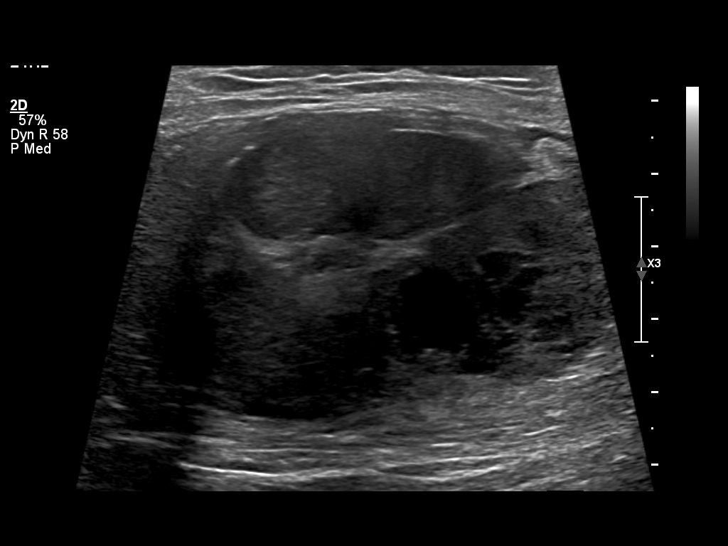
[im 20/33]
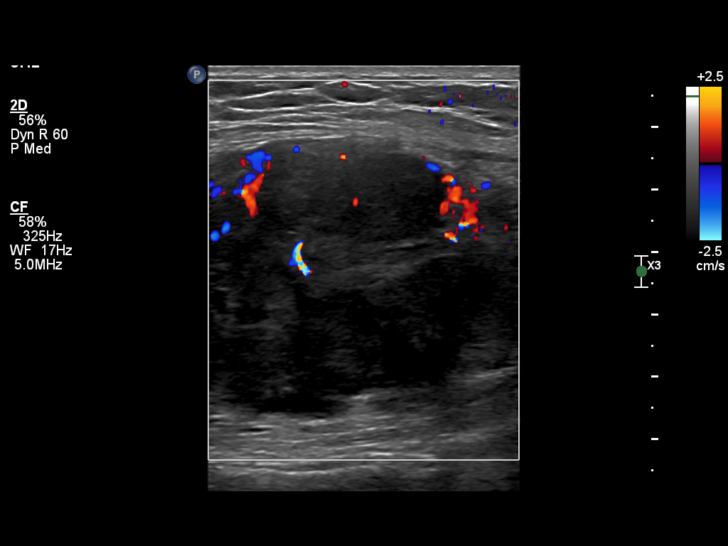
[im 22/33]
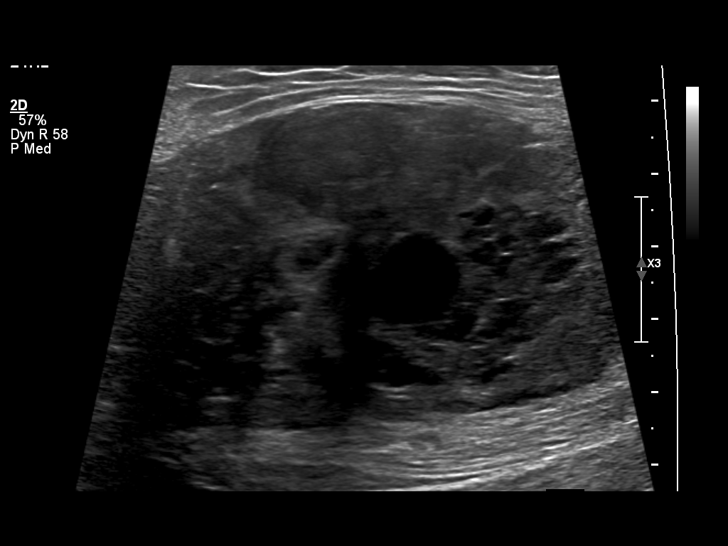
[im 26/33]
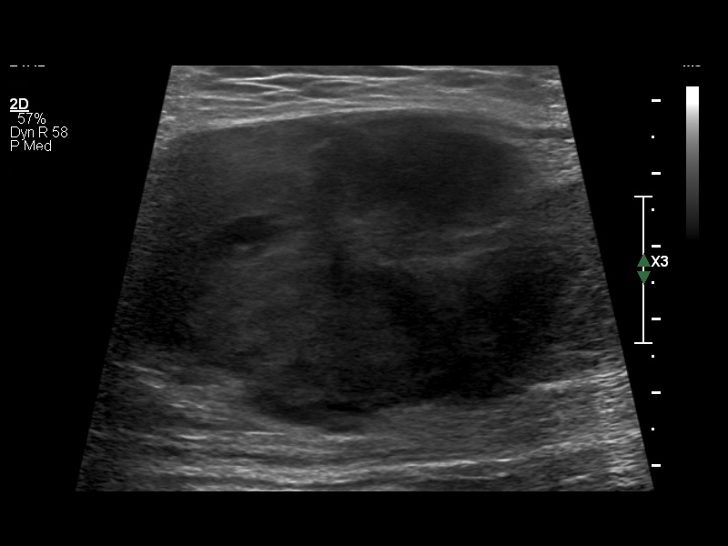
[im 28/33]
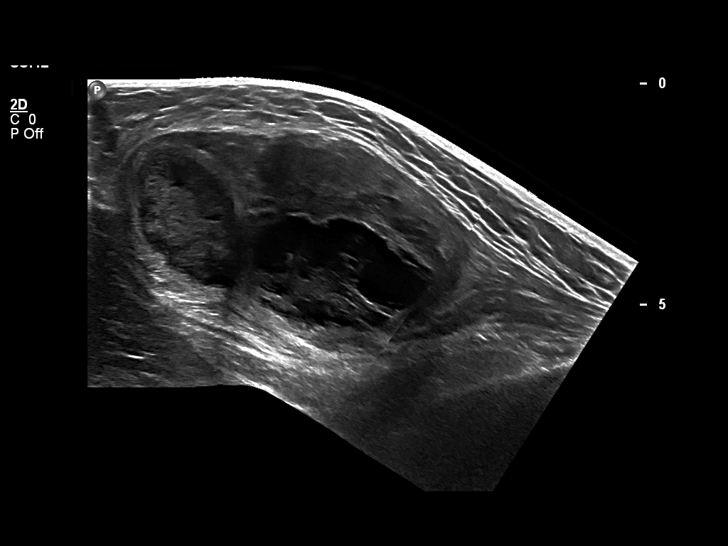
[im 30/33]
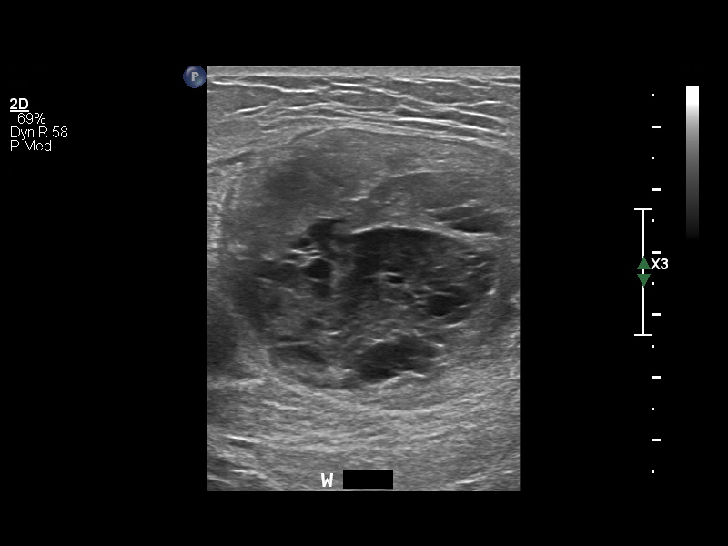
[im 33/33]
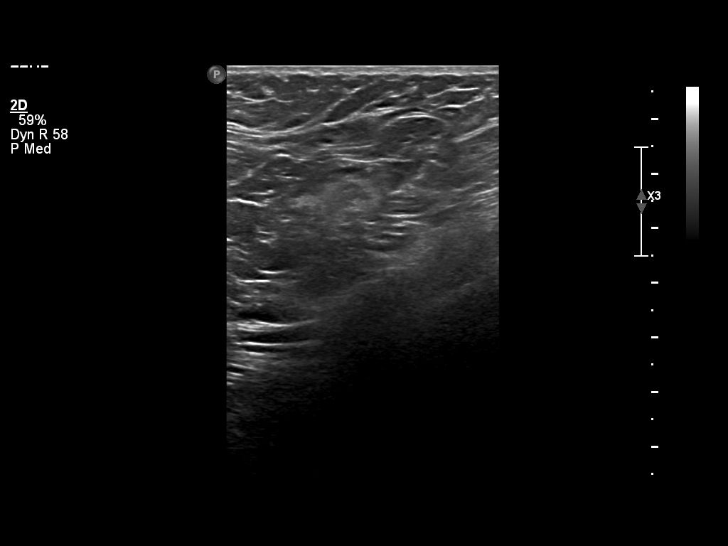

[14 of 16 positions shown; findings below may reference images not displayed]

DIAGNOSTIC STUDIES

EXAM

Ultrasound chest:

INDICATION

Right pectoralis mass.

TECHNIQUE

Sonographic evaluation of the abdomen was performed with multiple gray scale and color Doppler
images.

COMPARISONS

CT chest 02/25/2017.

FINDINGS

There is a 4.6 x 8.1 cm heterogeneous mildly vascular mass within the right anterior chest. No
pathologically enlarged axillary nodes.

IMPRESSION

1. There is a 4.6 x 8.1 cm heterogeneous mildly vascular mass within the right anterior chest.
Findings are suspicious for a neoplasm.

Follow-up with an ultrasound-guided biopsy is recommended.

Tech Notes:

TB

## 2020-09-14 ENCOUNTER — Encounter: Admit: 2020-09-14 | Discharge: 2020-09-14 | Payer: MEDICARE

## 2020-09-14 NOTE — Progress Notes
H&E Slides of [facility] # [id number], [id number], Path Date 08/08/20, requested on 09/14/20 from [facility].    Contact for outside path lab is [phone]   FED EX shipping label faxed (fax # [phone]) with shipment tracking # [id number].

## 2020-09-17 ENCOUNTER — Encounter: Admit: 2020-09-17 | Discharge: 2020-09-17 | Payer: MEDICARE

## 2020-09-17 DIAGNOSIS — R69 Illness, unspecified: Secondary | ICD-10-CM

## 2020-09-19 ENCOUNTER — Encounter: Admit: 2020-09-19 | Discharge: 2020-09-19 | Payer: MEDICARE

## 2020-09-19 DIAGNOSIS — C20 Malignant neoplasm of rectum: Secondary | ICD-10-CM

## 2020-09-20 ENCOUNTER — Encounter: Admit: 2020-09-20 | Discharge: 2020-09-20 | Payer: MEDICARE

## 2020-09-20 DIAGNOSIS — C801 Malignant (primary) neoplasm, unspecified: Secondary | ICD-10-CM

## 2020-09-20 DIAGNOSIS — D492 Neoplasm of unspecified behavior of bone, soft tissue, and skin: Secondary | ICD-10-CM

## 2020-09-20 DIAGNOSIS — I10 Essential (primary) hypertension: Secondary | ICD-10-CM

## 2020-09-20 DIAGNOSIS — K219 Gastro-esophageal reflux disease without esophagitis: Secondary | ICD-10-CM

## 2020-09-20 NOTE — Progress Notes
Patient Name: Dan Steele  Date of Birth: 1950/09/17  [place] Medical Record Number:  [id number]    Date of Service: 09/20/2020          Chief Complaint: Right pectoralis sarcomatoid neoplasm    History of Present Illness:       This is the initial visit for this 70 y.o. year old male presenting in consultation from Dr. Emmie Niemann Pendurti and  Dr. Alona Bene, Apolinar Junes  in reference to a lesion found in the right upper chest wall.  The patient describes noting the presence of the lesion over the past 2 months. The patient describes the lesion being noted as mild pain and presence of a mass he just noticed when he woke up one morning.. It has not changed in terms of its character nor has it caused any overlying skin changes. There was no antecedent history of trauma nor change of activities to have produced the mass. There is no description of numbness, tingling, or radiating pain distal within the extremity. The patient is fully functional despite the presence of this mass.   Other associated symptoms described in reference to the lesion include: No nocturnal pain.    The patient presented to their physician who performed appropriate examination and  radiographic studies. The studies demonstrated the presence of a 15 cm lesion in the right pectoralis major muscle.  The patient has been referred in consultation with regard to this lesion.  CT scan of the chest taken on Sep 04, 2020 demonstrated a large heterogeneous necrotic lesion involving the right pectoralis muscle and PET scan was performed demonstrating avid radiotracer activity in this area as well.  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    Past Medical History:       Medical History:   Diagnosis Date   ? Acid reflux     tums prn    ? Adenocarcinoma (HCC)     of rectum    ? Hypertension        Past Surgical History:     Surgical History:   Procedure Laterality Date   ? ROBOTIC LOW ANTERIOR RESECTION, FLEXIBLE SIGMOIDOSCOPY, N/A 04/08/2017 Performed by Benetta Spar, MD at [facility] OR   ? COLONOSCOPY WITH BIOPSY - FLEXIBLE - POSSIBLE POLYPECTOMY N/A 05/04/2018    Performed by Benetta Spar, MD at [facility] OR   ? COLONOSCOPY         Current Medications:       Encounter Medications   Medications   ? Omega-3 Acid Ethyl Esters (LOVAZA) 1 gram capsule     Sig: Take 2,000 mg by mouth.   ? ondansetron HCL (ZOFRAN) 8 mg tablet     Sig: as Needed.   ? traMADoL (ULTRAM) 50 mg tablet     Sig: as Needed.       Allergies:     No Known Allergies    Social History:       Social History     Tobacco Use   ? Smoking status: Former Smoker     Packs/day: 0.50     Years: 10.00     Pack years: 5.00     Types: Cigarettes     Quit date: 1988     Years since quitting: 34.4   ? Smokeless tobacco: Never Used   Substance Use Topics   ? Alcohol use: Yes     Alcohol/week: 14.0 standard drinks     Types: 14 Cans of  beer per week   ? Drug use: No        Family History:       Family History   Problem Relation Age of Onset   ? Cancer-Breast Mother    ? Diabetes Mother    ? Cancer Father    ? Diabetes Father        Review of Systems:     General: No adenopathy or cafe au lait spots. No bleeding diatheses, spontaneous bruising or other bleeding disorders.   Constitutional: No fevers, chills, weight loss, malaise, or other constitutional symptoms.   HEENT: Negative for trouble swallowing, sight issues pertaining to reasons to current visit, hearing problems related to this visit, hemoptysis.   Respiratory: Negative. Negative for cough and shortness of breath. No hemoptysis  Cardiovascular: Negative for chest pain, shortness of breath. or cardiogenic shock symptoms   Gastrointestinal: Negative for nausea, abdominal pain, diarrhea and constipation. No GI distress  Genitourinary: Negative. No hematuria  Endocrine: o night sweats, weight change, polyuria, polydipsia  Skin: Negative. Negative for rash.   Neurological: Negative for dizziness and headaches.   Psychiatric/Behavioral: Negative.   Musculoskeletal: Negative other than that pertaining to current tumor issue.        Physical Exam:       Blood pressure (!) 128/94, pulse 79, temperature 36.5 ?C (97.7 ?F), temperature source Temporal, resp. rate 18, height 177.8 cm (5' 10), weight 103.9 kg (229 lb), SpO2 99 %.  GEN: A&O. NAD   EXTREM: Examination of the right anterior chest wall demonstrates a greater than 15 cm semifirm soft tissue mass in the right pectoralis major muscle.  There does not seem to be any adenopathy no restriction in range of motion of the shoulder or arm.  The mass does glide smoothly over the anterior ribs and osseous structures.  NEURO: Grossly intact. Motor function 5/5, sensation grossly intact.        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 The radiographic as well as clinical characteristics do make me concerned for this diagnosis, and therefore I do believe that biopsy and further evaluation and/or staging studies are warranted. While the differential diagnosis includes soft tissue sarcoma, it is also possible that this represents a benign tumor as well, however the possibility of a benign tumor does not necessarily indicate that the lesion might not be benign active or aggressive.     The differential diagnosis in this case would include undifferentiated pleomorphic sarcoma.     The nature and natural history and treatment options for this tumor were discussed in great detail with the patient and family members present. I educated the patient regarding the treatment and care for soft tissue sarcoma. While surgical resection is the primary treatment, occasionally, and in fact, frequently, we utilize both chemotherapy and radiation as well. The order of therapy is also based on the individual patient and tumor location and histology type. The surgical resection does require a wide, en bloc resection. This entails removal of the entire tumor in one piece with a cuff of normal tissue surrounding the tumor. Normal tissue does typically include muscle and possibly other more vital structures which were discussed in detail with the patient as well as the ramifications of their removal. I also educated the patient in regards to the other  treatment modalities indicated above, including chemotherapy and radiation. However, this will be further addressed by both medical oncology and radiation oncology.     I have discussed in detail with the patient, for approximately 25 minutes in length, the nature and natural history and treatment options. The patient asked appropriate questions answered to their satisfaction.  Discussion with regard to the recommended treatments as well as alternative treatments were also discussed.  Educational material was also provided in reference to this patient's particular situation.    Plan:     The patient and family members present were educated as to the nature, natural history, and treatment options concerning this diagnosis.  I did discuss alternative treatment options as well as ramifications of no treatment. We will plan on scheduling the patient for an MRI scan of the anterior chest wall.  I would also like him to be evaluated by radiation oncology for consideration of neoadjuvant radiation to this area.  Surgical resection will be required which would entail a radical en bloc resection of the right anterior chest wall mass.  We did talk about the possible consideration of chemotherapy as well.    Talbert Nan, MD   Professor of Orthopedic Surgery  [facility]

## 2020-09-25 ENCOUNTER — Encounter: Admit: 2020-09-25 | Discharge: 2020-09-25 | Payer: MEDICARE

## 2020-09-25 IMAGING — MR CHEST^CHEST -  WITHOUT
12 series · 16 of 16 positions shown · IV contrast (with contrast)
Comparison: none

[Series 2: T1 · sagittal · 5.0mm · 0.51mm/px · 3 of 25 slices shown (1 of 3)]
[im 1/25]
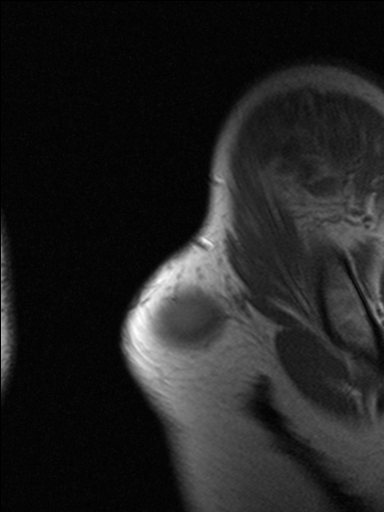
[im 13/25]
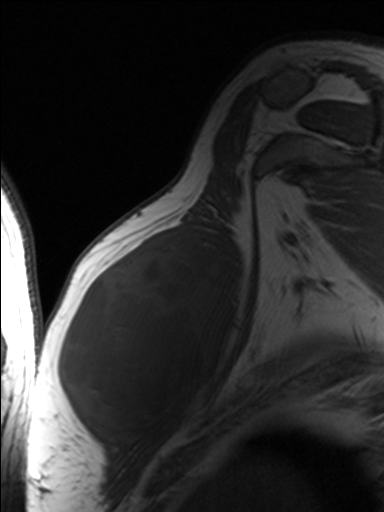
[im 25/25]
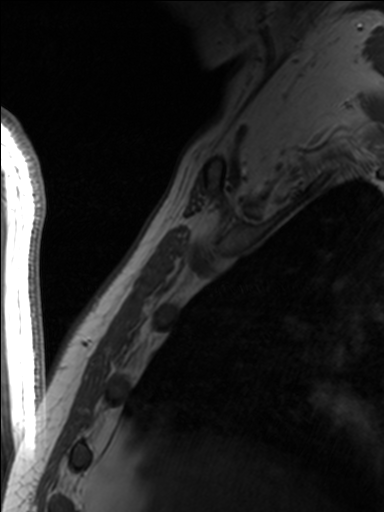

[Series 3: T2 · sagittal · 5.0mm · 0.68mm/px · 2 of 25 slices shown (1 of 2)]
[im 1/25]
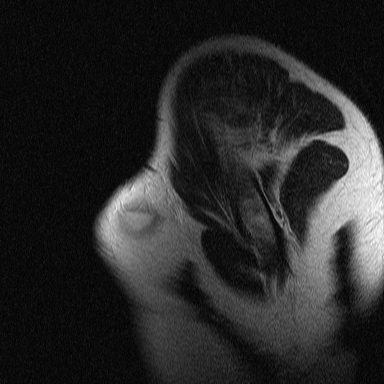
[im 25/25]
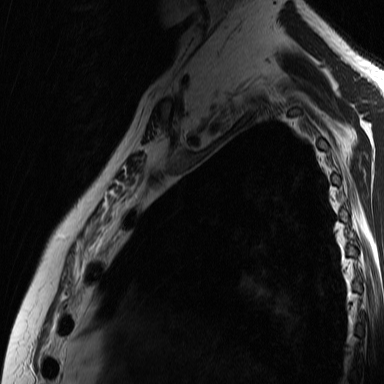

[Series 4: T1 · coronal · 5.0mm · 1.04mm/px · 1 of 20 slices shown (2 of 3)]
[im 1/20]
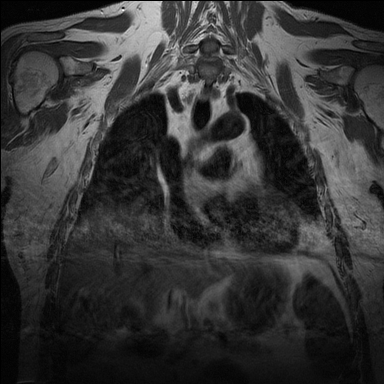

[Series 5: STIR · coronal · 5.0mm · 0.78mm/px · 1 of 20 slices shown]
[im 1/20]
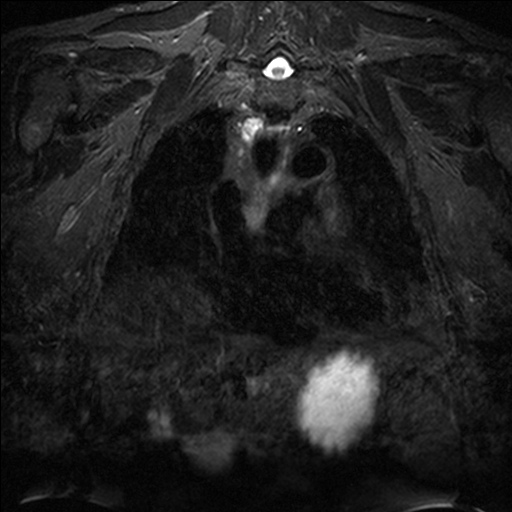

[Series 7: T2 fat-sat · axial · 4.5mm · 0.94mm/px · 1 of 22 slices shown (1 of 3)]
[im 1/22]
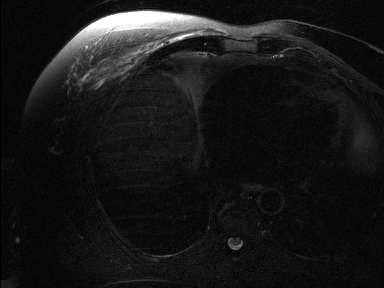

[Series 8: T2 fat-sat · coronal · 4.0mm · 0.78mm/px · 1 of 20 slices shown (2 of 3)]
[im 1/20]
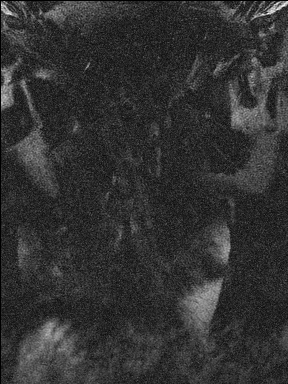

[Series 9: T2 fat-sat · sagittal · 4.0mm · 0.78mm/px · 1 of 24 slices shown (3 of 3)]
[im 1/24]
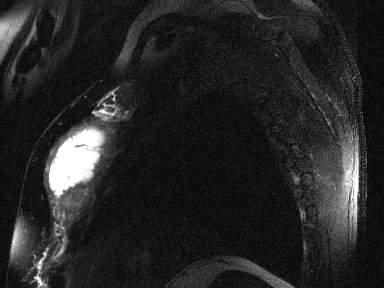

[Series 10: T1 · axial · 4.0mm · 1.07mm/px · 1 of 22 slices shown (3 of 3)]
[im 1/22]
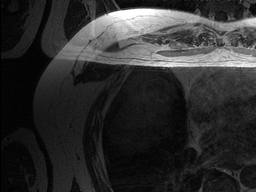

[Series 12: T2 · axial · 5.0mm · 0.68mm/px · z∈[-64,+80]mm · 2 of 25 slices shown (2 of 2)]
[im 1/25]
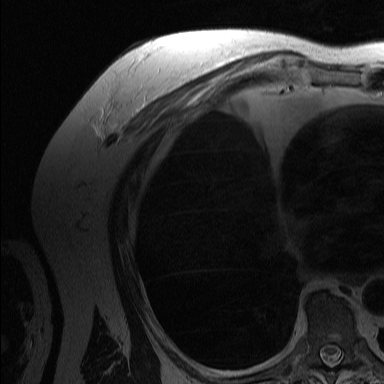
[im 25/25]
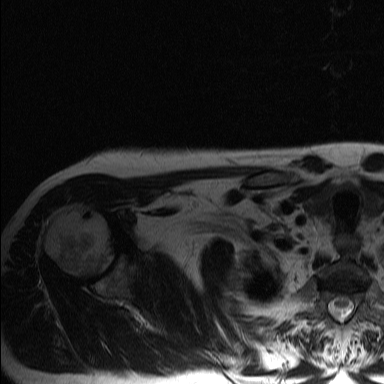

[Series 13: T1 fat-sat · sagittal · 4.5mm · 1.07mm/px · 1 of 24 slices shown (1 of 3)]
[im 1/24]
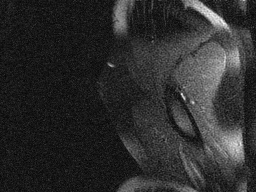

[Series 14: T1 fat-sat · axial · 4.0mm · 1.56mm/px · 1 of 24 slices shown (2 of 3)]
[im 1/24]
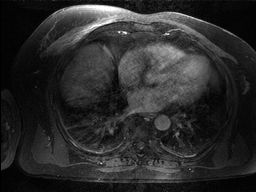

[Series 16: T1 fat-sat · coronal · 5.0mm · 0.65mm/px · 1 of 15 slices shown (3 of 3)]
[im 1/15]
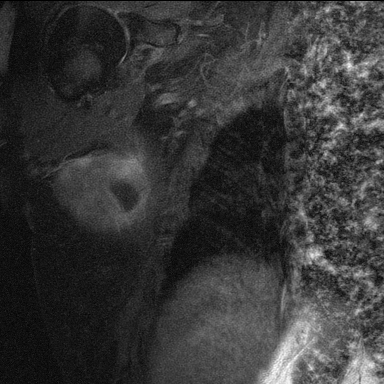

[16 of 16 positions shown; findings below may reference images not displayed]

Ordering:    RAA, YUSUF EREN

EXAM

MR chest wo/w con

INDICATION

Malignant soft tissue tumor of the chest wall

TECHNIQUE

Multiplanar, multisequence imaging of the chest with and without contrast.

COMPARISONS

Ultrasound biopsy of the soft tissue mass 08/08/2020

FINDINGS

TUMOR:
1. Large predominantly peripherally nodular enhancing heterogeneous mass with oval-shaped
morphology within the right pectoralis major muscle. Mass measures approximately 8.1 x 14.1 x
cm.
2. Heterogeneous insinuating tendril like enhancement extending medially along the right pectoralis
major muscle suspicious for neoplastic invasion/involvement propagating 2.6 cm medial to the well-
defined medial margin of the primary mass (series 14, image 12 and series 16, image 5).
3. Extensive internal cystic necrosis. Fluid fluid level of varying intensity at the right lateral
margin of the lesion with intrinsic T1 hyperintensity suggestive of blood products (series 13, image
4. Along the lateral margin, there is faint enhancement which may be related to vascular
recruitment in the superficial subcutaneous fat (series 14, image 15).
5. Posterior margin of the heterogeneous mass abuts the pectoralis minor muscle with preservation
of the intervening fat plane (series 12, image 13).
6. At the superior margin, there is insinuating edema and enhancement within the pectoralis major
muscle and neoplastic invasion cannot be excluded (series 13, image 21). Extent of enhancement
propagates 0.9 cm superiorly.
7. Anterior margin is likely bounded by the pectoralis major fascia. Lesion is approximately 1 cm
underneath the skin through the subcutaneous fat (series 15, image 15).

BONES: Normal signal of the osseous structures

MUSCLES: Aside from the mass located within the right pectoralis major muscle, there is no
additional abnormal enhancement in the visualized muscular structures

NEUROVASCULAR: Intact.

OTHER: Minimal right gynecomastia (series 13, image 15). No visualized lymphadenopathy on the
provided examination.

IMPRESSION
1. Extensive heterogeneous neoplasm with internal blood products and cystic necrosis centered
within the right pectoralis major muscle with boundaries as above.
2. Preservation of the intervening intermuscular fascial plane in between the pectoralis major and
minor muscles.

Tech Notes:

MALIGNANT SARCOMATOID  NEOPLASM OF RIGHT CHEST WALL, BX [DATE], PT TO UNDERGO 6 WEEKS OF RADIATION
BEFORE SX.  15 ML GADAVIST.  RG

## 2020-09-28 ENCOUNTER — Encounter: Admit: 2020-09-28 | Discharge: 2020-09-28 | Payer: MEDICARE

## 2020-09-28 DIAGNOSIS — D492 Neoplasm of unspecified behavior of bone, soft tissue, and skin: Secondary | ICD-10-CM

## 2020-10-02 ENCOUNTER — Encounter: Admit: 2020-10-02 | Discharge: 2020-10-02 | Payer: MEDICARE

## 2020-10-03 IMAGING — US [PERSON_NAME]
1 series · 14 of 16 positions shown · non-contrast
Comparison: none

[Series 1: us thyroid · 40 acquisitions, 14 frames shown]
[im 1/40]
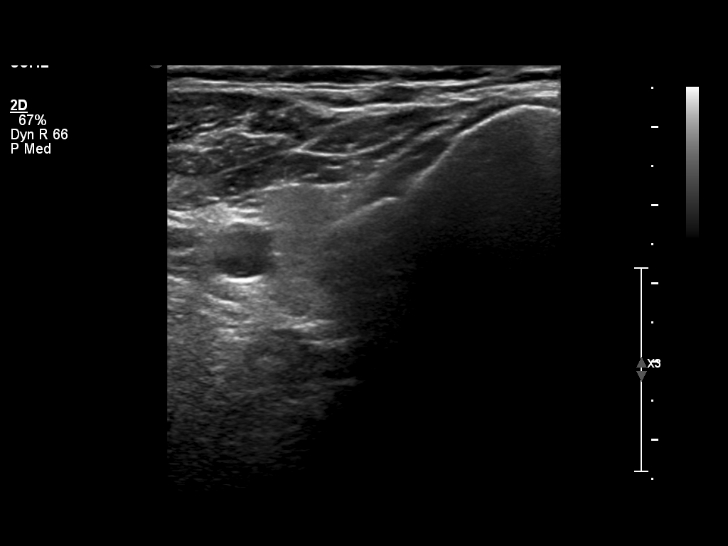
[im 3/40]
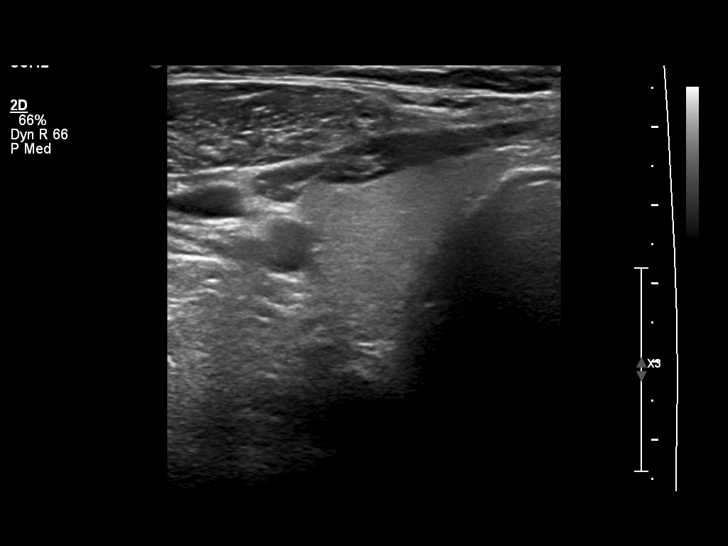
[im 6/40]
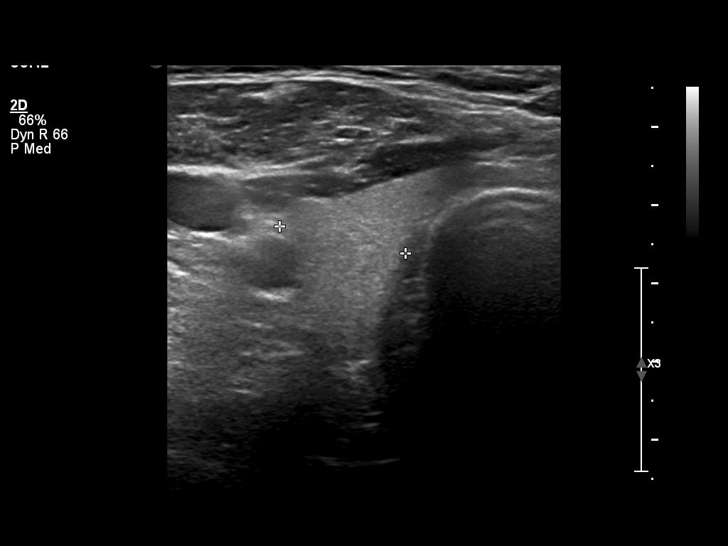
[im 11/40]
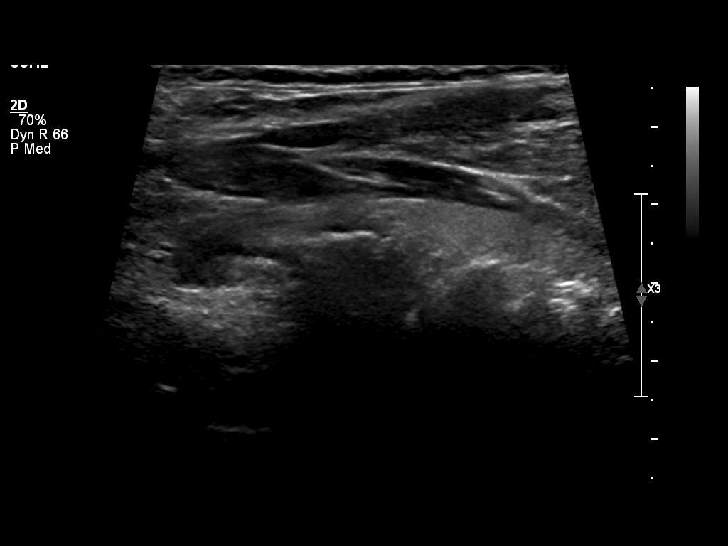
[im 14/40]
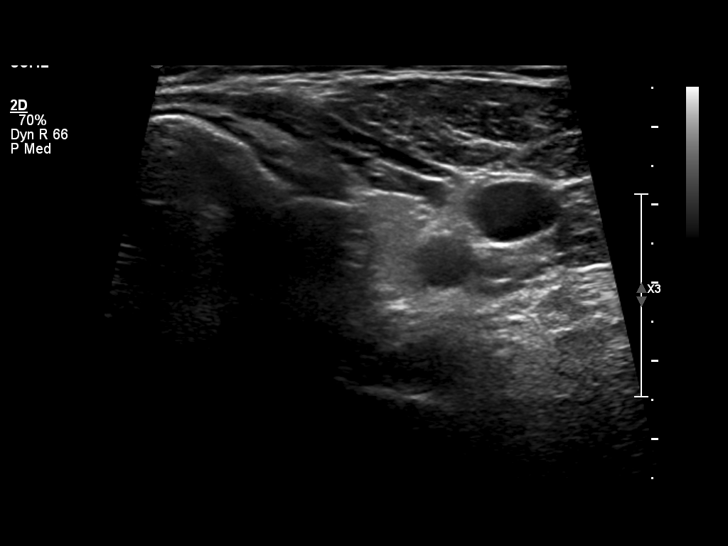
[im 16/40]
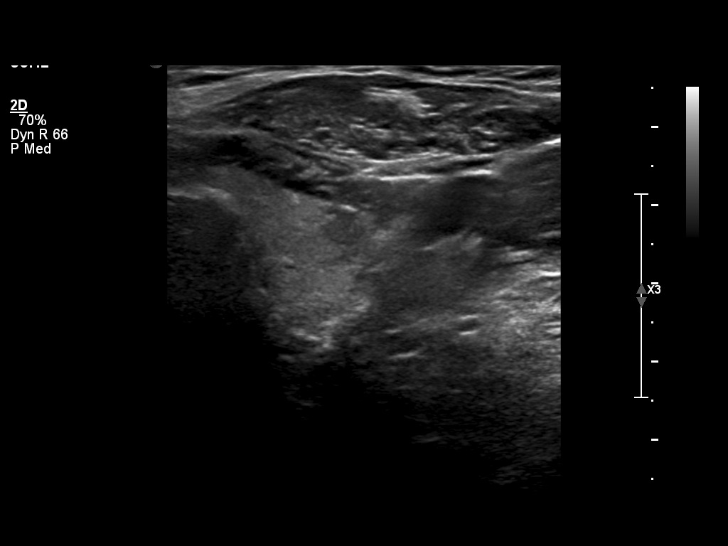
[im 19/40]
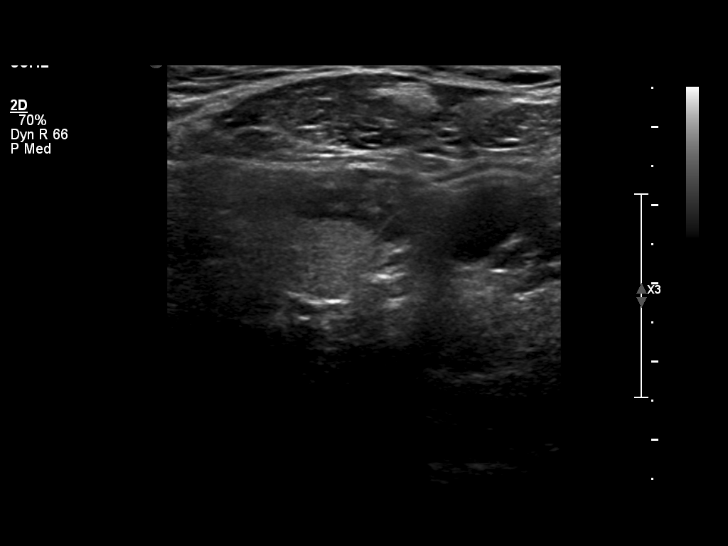
[im 21/40]
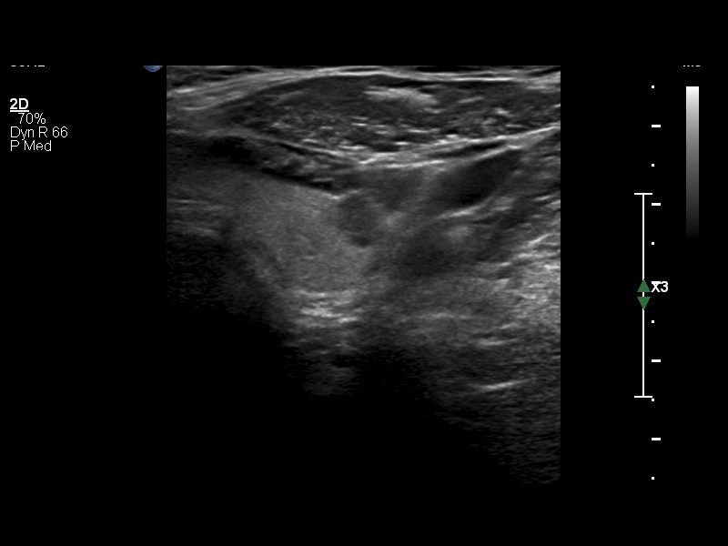
[im 24/40]
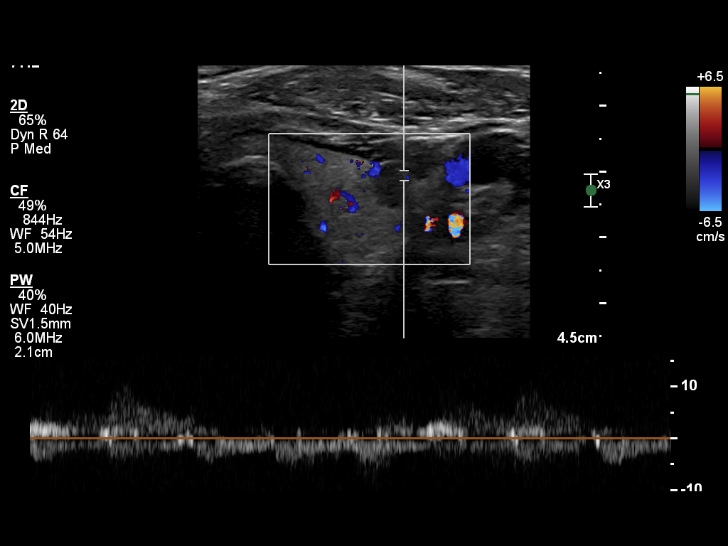
[im 27/40]
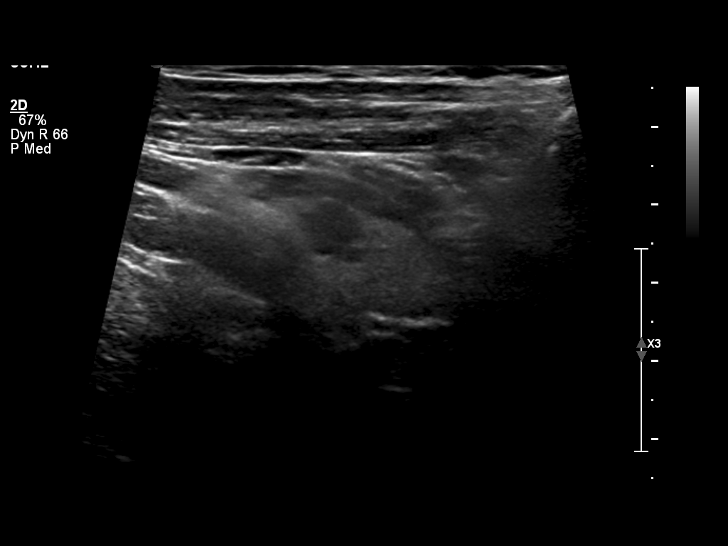
[im 32/40]
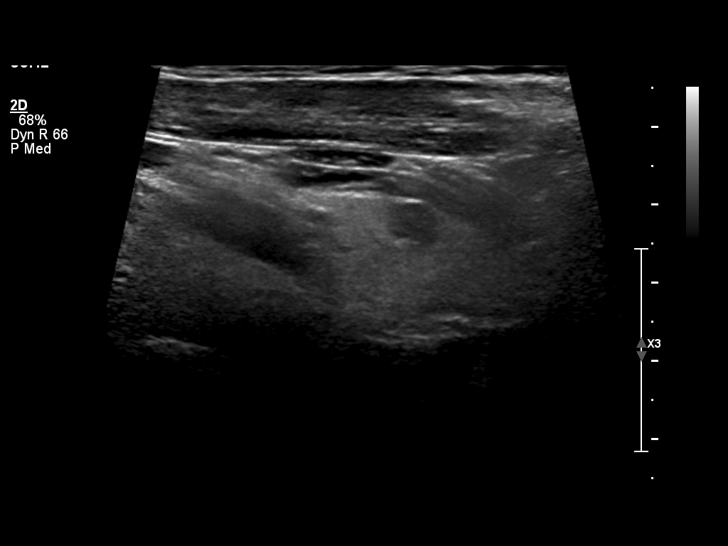
[im 34/40]
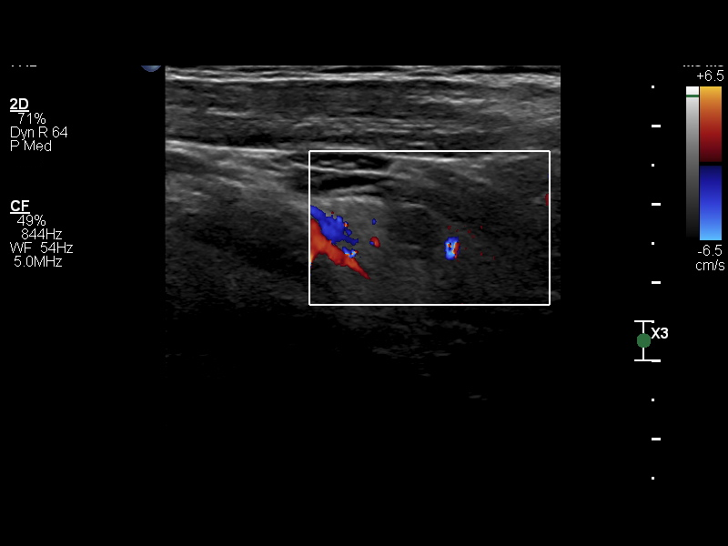
[im 37/40]
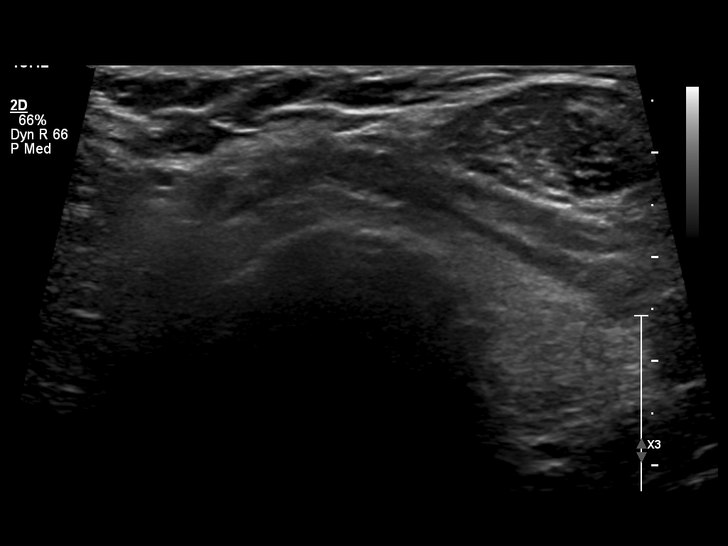
[im 40/40]
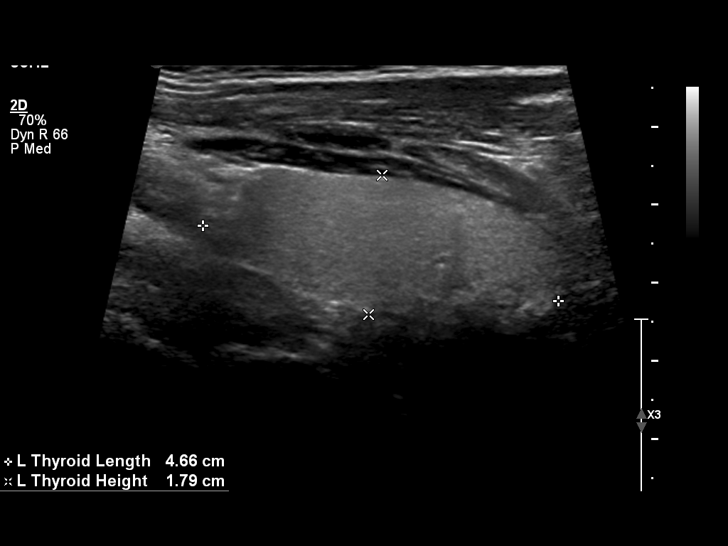

[14 of 16 positions shown; findings below may reference images not displayed]

EXAM

US THYROID/NECK

INDICATION

malignant neoplasm of connective   soft tissue of

TECHNIQUE

US THYROID/NECK

COMPARISONS

None available

FINDINGS

Right thyroid lobe measures 5.1 x 2.2 x 1.7 cm. Homogeneous echotexture.

Left thyroid lobe measures 4.7 x 1.8 x 1.9 cm. Homogeneous echotexture. There is an ill-defined
hypoechoic mass within the middle aspect of the left thyroid lobe which measures 1.1 x 0.75 x
cm. This mass appears to be predominantly solid,, wider than tall, slightly irregular margins, no
internal hyperechoic microcalcifications.

Isthmus measures 0.7 cm.

IMPRESSION

There is a left-sided TI-RADS 4 thyroid nodule which measures up to 1.1 cm. Consider fine needle
aspiration for further evaluation.

TI-RADS criteria utilized.

Tech Notes:

## 2020-10-03 IMAGING — US ABDLM
1 series · 14 of 25 positions shown · non-contrast
Comparison: none

[Series 1: us abdomen limited · 81 acquisitions, 14 frames shown]
[im 1/81]
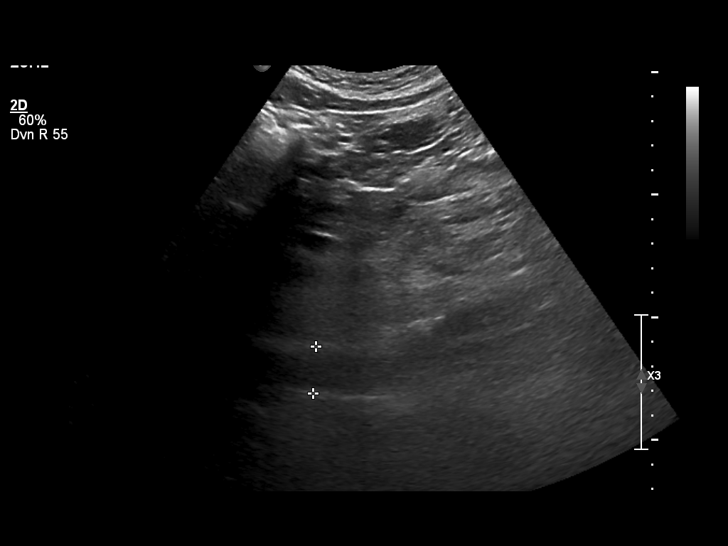
[im 7/81]
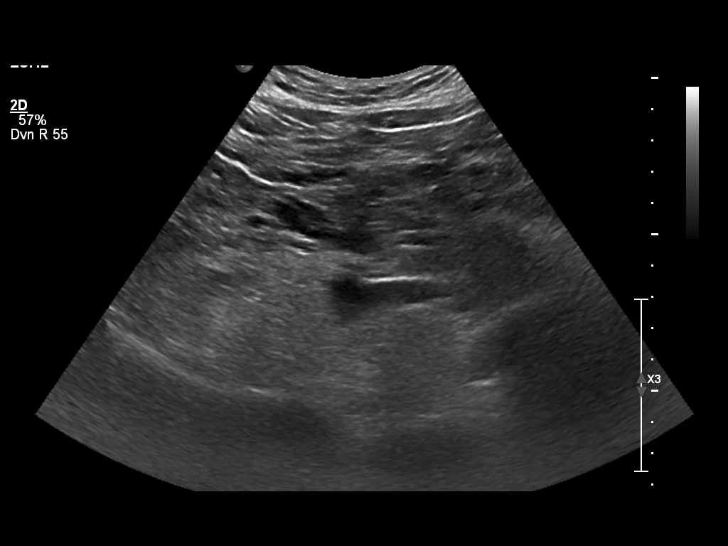
[im 14/81]
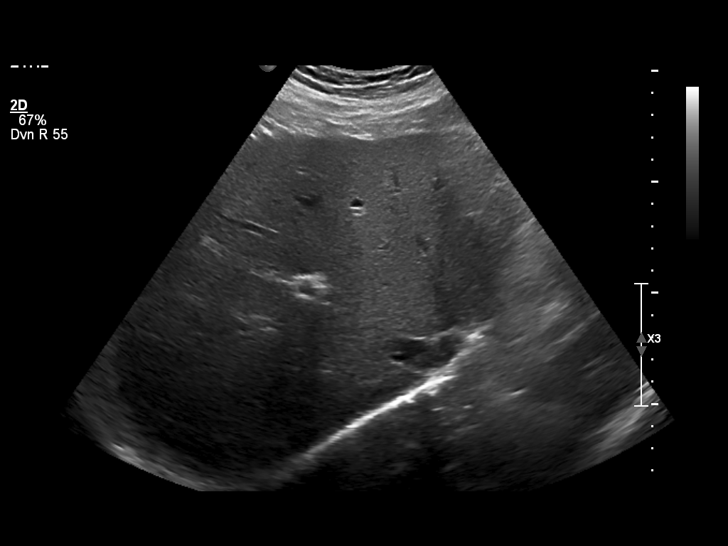
[im 21/81]
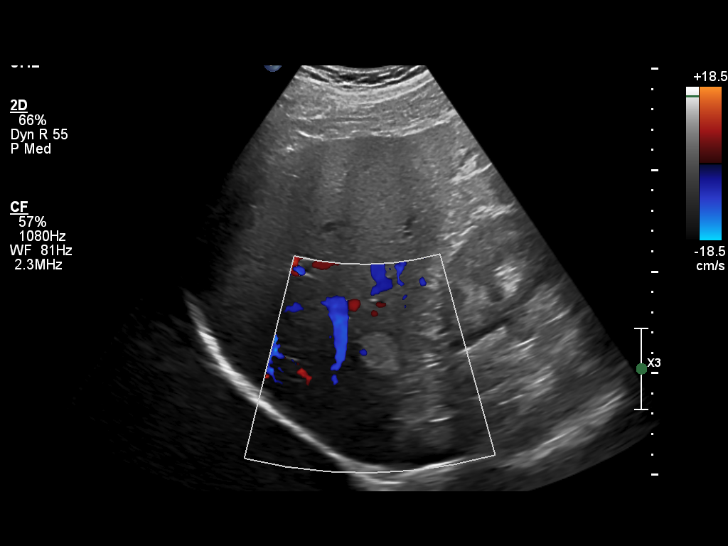
[im 27/81]
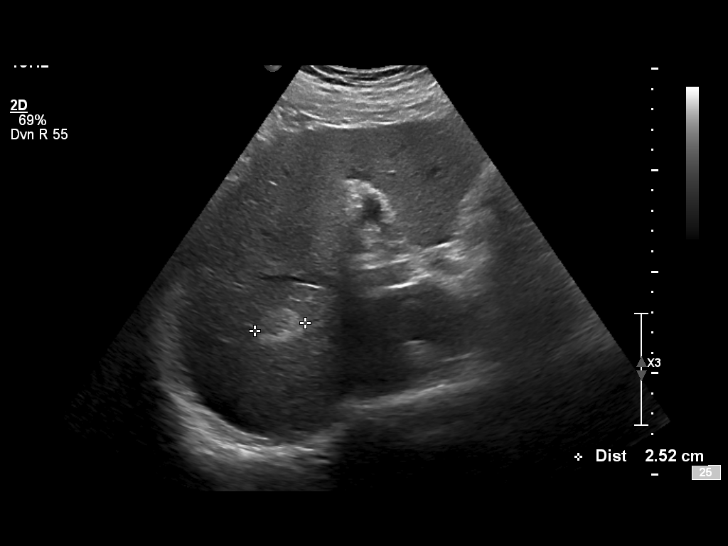
[im 31/81]
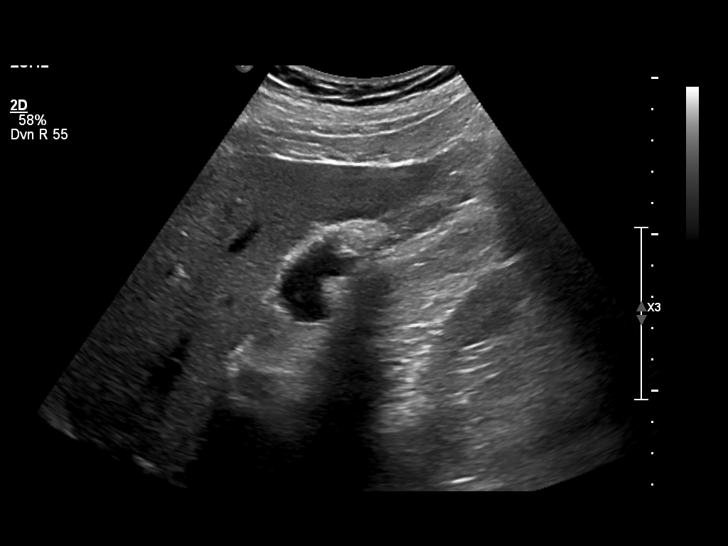
[im 37/81]
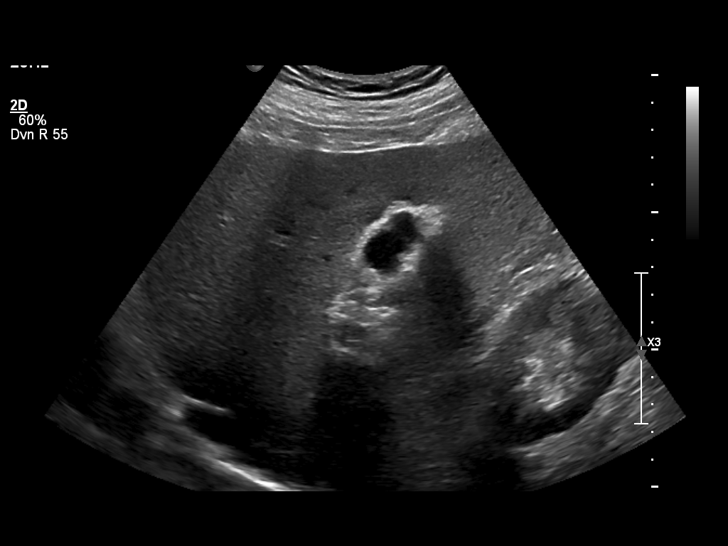
[im 44/81]
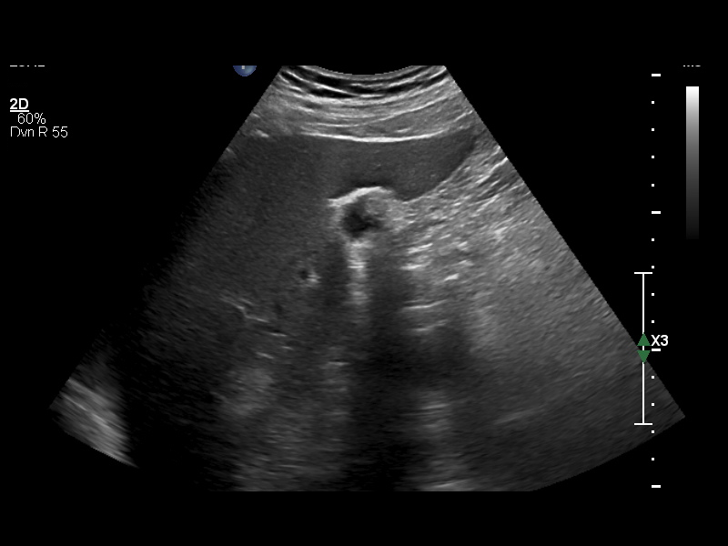
[im 51/81]
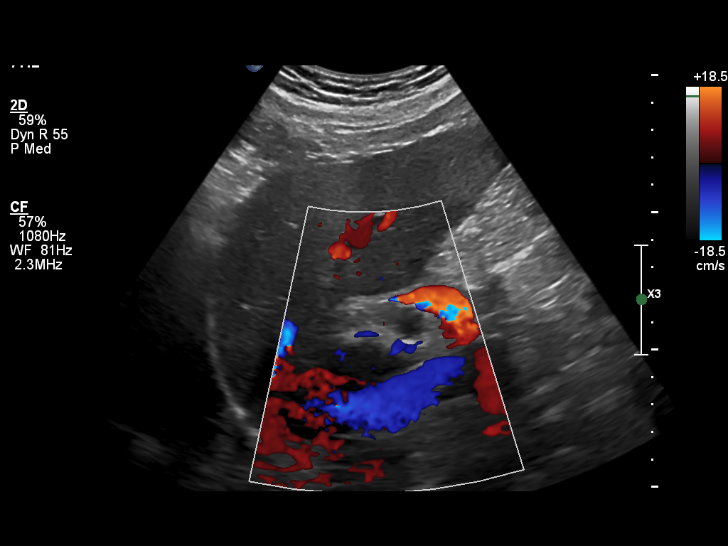
[im 54/81]
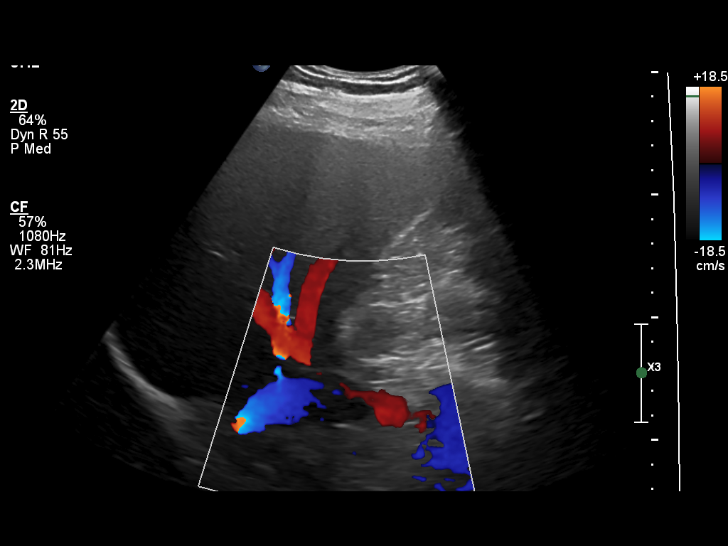
[im 61/81]
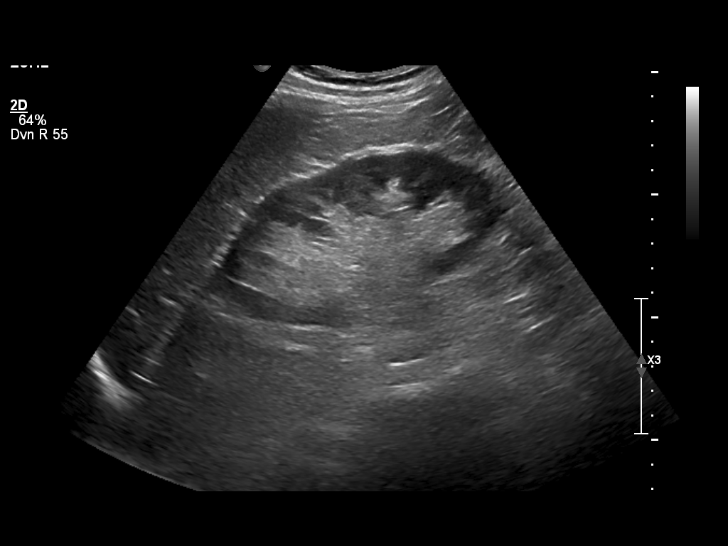
[im 67/81]
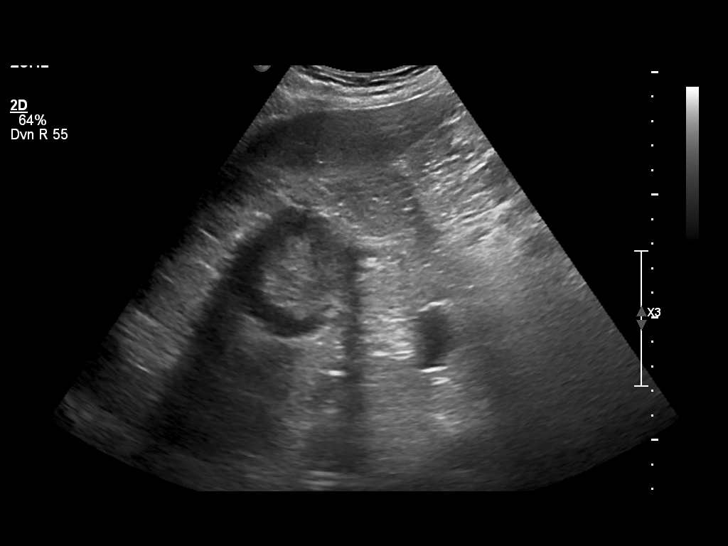
[im 74/81]
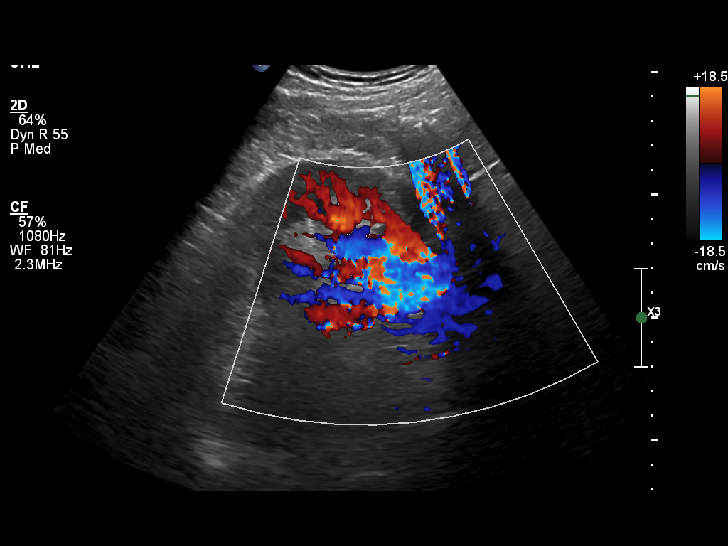
[im 81/81]
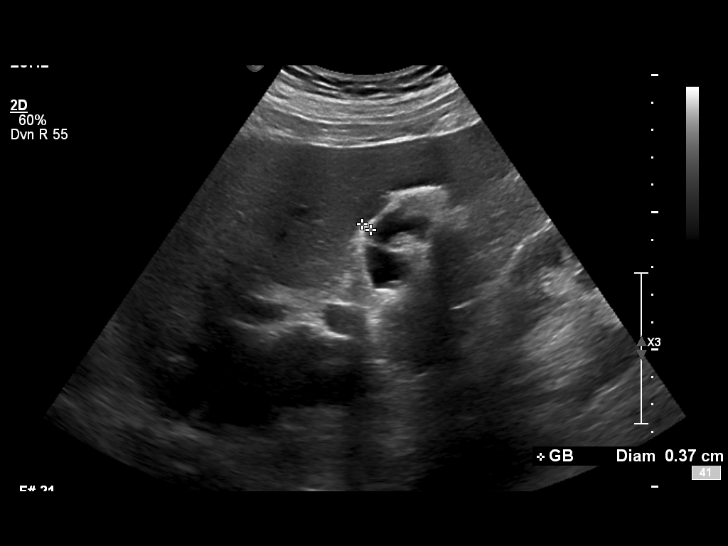

[14 of 25 positions shown; findings below may reference images not displayed]

EXAM

US ABDOMEN COMPLETE

INDICATION

malignant neoplasm of connective   soft tissue of

COMPARISONS

CT dated 02/20/2017

FINDINGS

The liver measures 16.6  cm, normal. A Well-circumscribed hyperechoic focus is seen within the
right hepatic lobe measuring 2.2 x 2.1 x 2.5 cm compatible with hemangioma and similar to that seen
on prior exams.

The common bile duct measures 0.9  cm. The gallbladder wall measures 0.37  cm, likely accented by
state of contraction. No cholelithiasis, pericholecystic fluid, or biliary sludge.  Negative
sonographic Murphy's sign.

Limited evaluation of the pancreas secondary to overlying bowel gas and body habitus.

The right kidney measures 12.4  x 6.6  x 6.5  cm, Normal echogenicity. It anechoic cyst is seen in
the lateral aspect of the kidney measuring up to 2.0 cm.

The maximal AP and transverse dimensions of the abdominal aorta measure 1.9  x 1.8  cm, within
normal limits. No aneurysmal dilatation of the entire aorta.

IMPRESSION
1. Right hepatic lobe hemangioma which measures up to 2.5 cm and appears cysts stable in size
compared to CT dated 12/05/2016.
2. Mild common bile duct prominence, a nonspecific finding.

Tech Notes:

## 2020-10-24 ENCOUNTER — Encounter: Admit: 2020-10-24 | Discharge: 2020-10-24 | Payer: MEDICARE

## 2020-10-31 IMAGING — US BXTHYROID
1 series · 14 of 23 positions shown · non-contrast
Comparison: none

[Series 1: us biopsy thyroid core needle · 23 acquisitions, 14 frames shown]
[im 1/23]
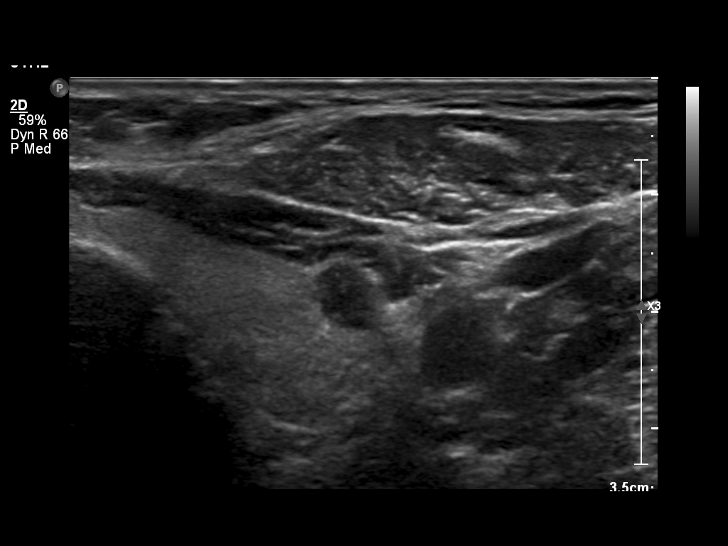
[im 3/23]
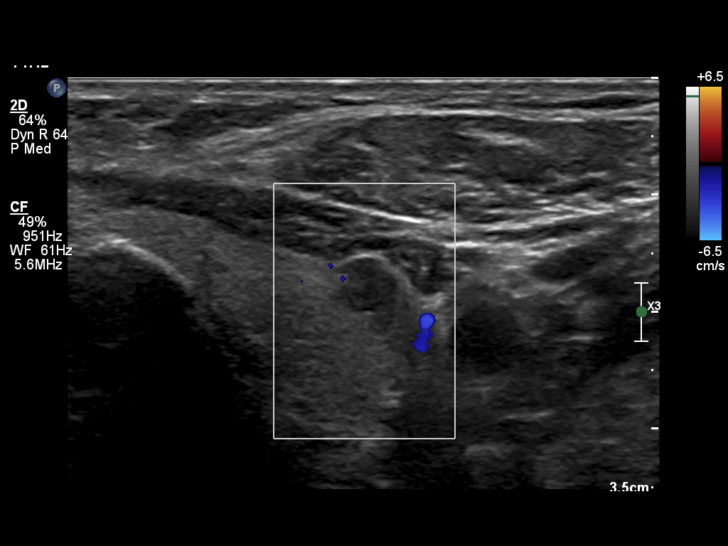
[im 5/23]
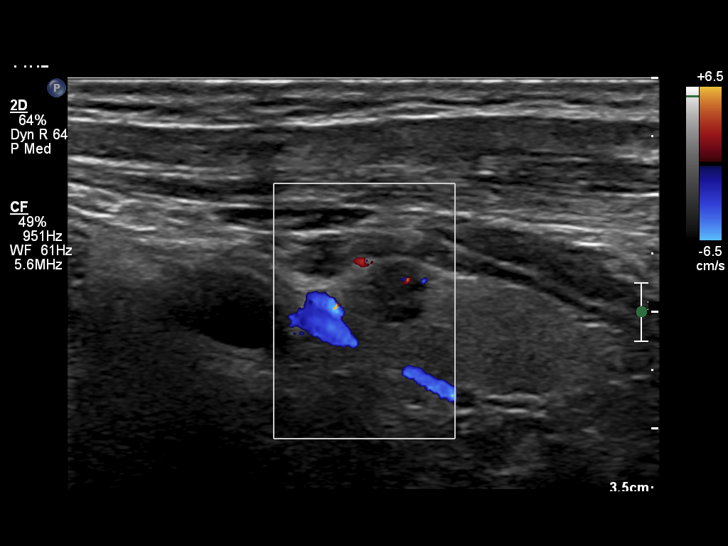
[im 6/23]
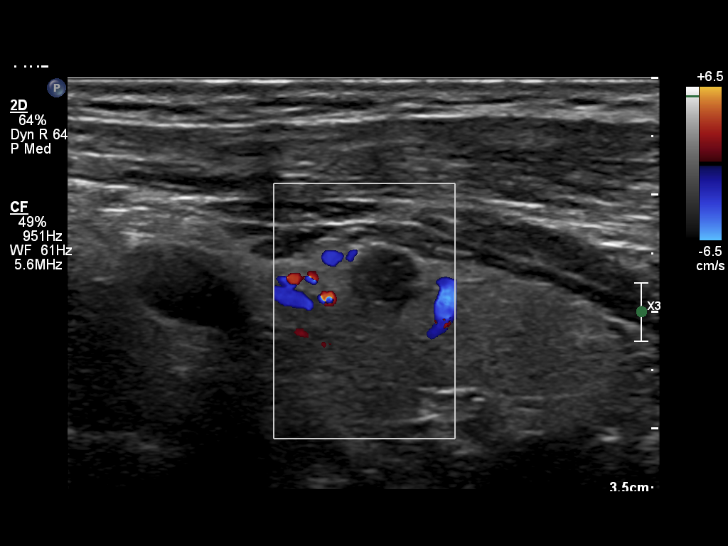
[im 8/23]
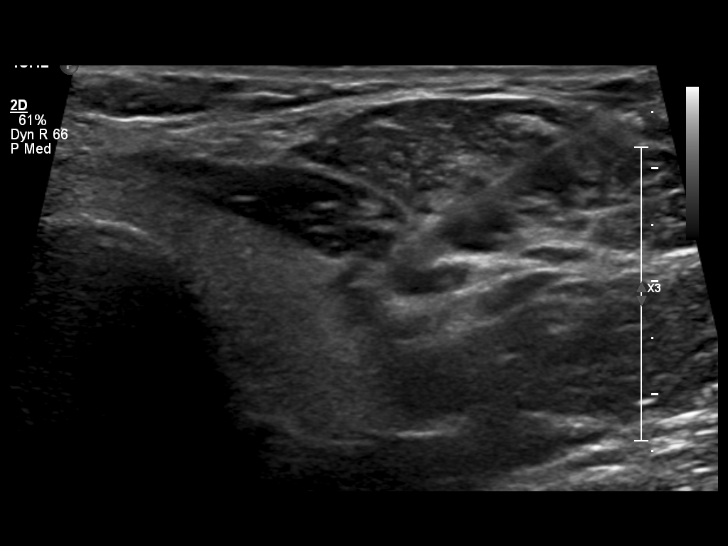
[im 10/23]
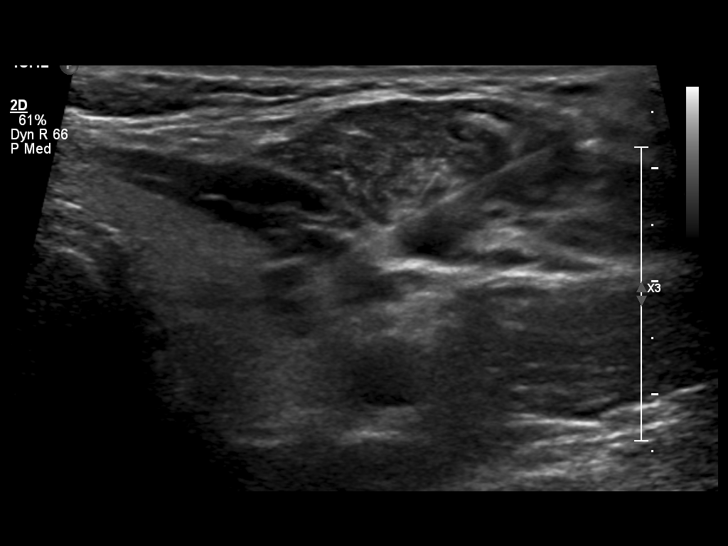
[im 11/23]
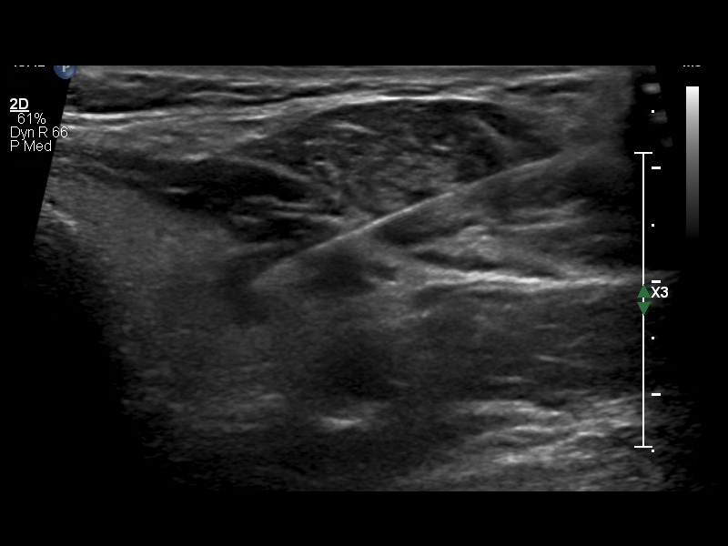
[im 13/23]
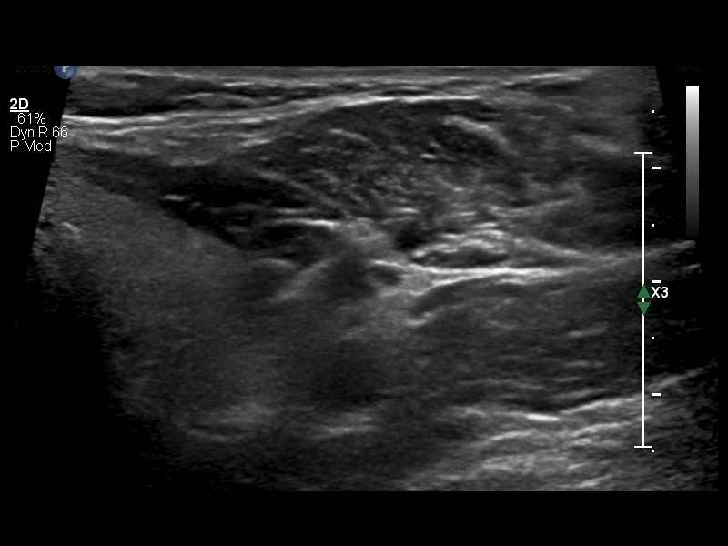
[im 14/23]
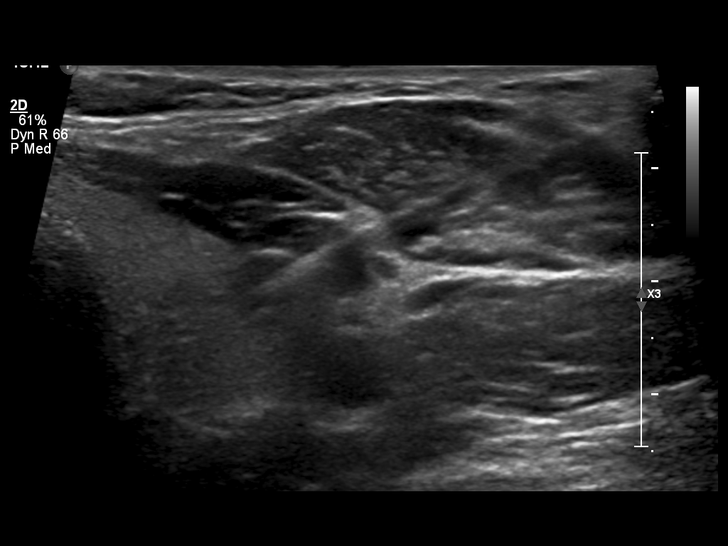
[im 16/23]
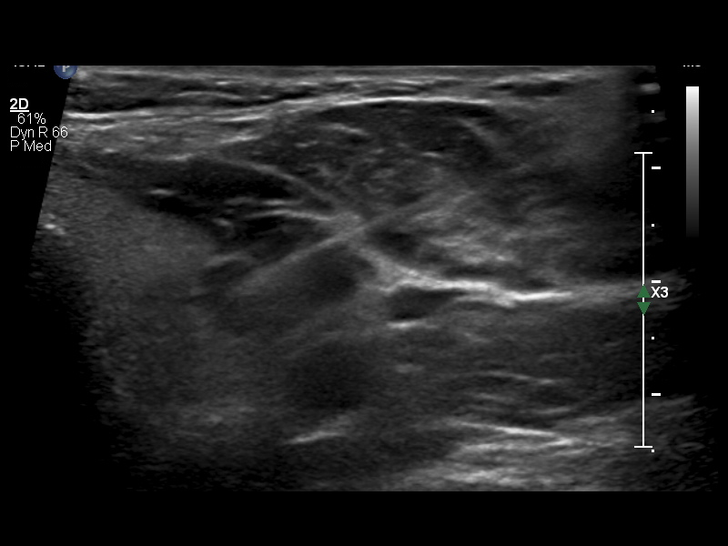
[im 18/23]
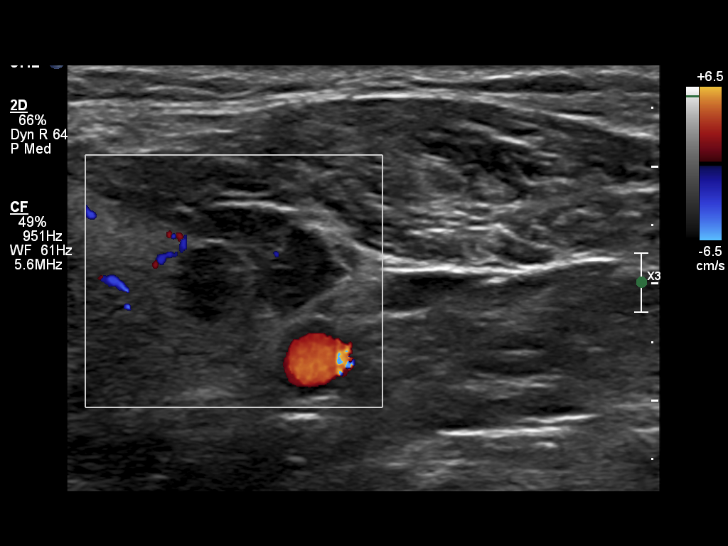
[im 19/23]
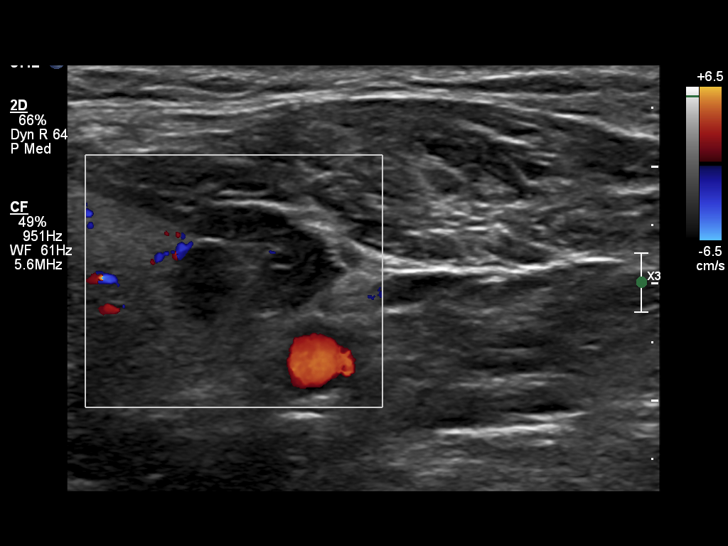
[im 21/23]
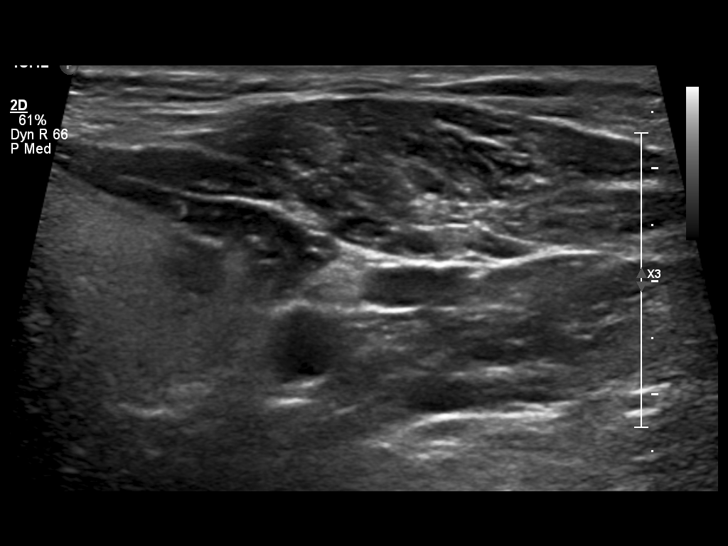
[im 23/23]
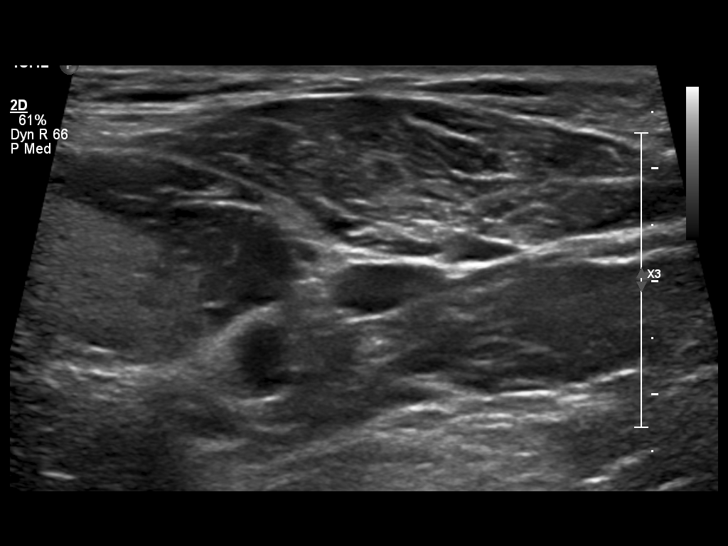

[14 of 23 positions shown; findings below may reference images not displayed]

US Guided FNA, 1st Lesion Sex:

EXAM

US Guided FNA, 1st Lesion

INDICATION

Thyroid lesion

TECHNIQUE

Grayscale and Color Doppler imaging evaluation in the region of concern was performed with
procedure as detailed below.

COMPARISONS

Ultrasound of the thyroid 10/03/2020

FINDINGS

After informed consent, the area was sterilely prepped and draped. 1% lidocaine was injected along
the skin and trajectory towards the lesion. Subsequently, 5, 25 gauge needles with slight aspiration
pressure was inserted into the lesion in question in the left thyroid lobe. The final needle was
removed. The needles were handed immediately to a technologist.

Pressure was held until hemostasis. The area was then cleaned and bandaged.

The patient was then discharged home without immediate complication.

ESTIMATED BLOOD LOSS: Less than 5 mL

IMMEDIATE COMPLICATIONS: None

IMPRESSION
1. Successful ultrasound-guided fine-needle aspiration of the left thyroid lobe lesion.

Tech Notes:

TM/JL

## 2020-11-09 ENCOUNTER — Encounter: Admit: 2020-11-09 | Discharge: 2020-11-09 | Payer: MEDICARE

## 2020-11-12 ENCOUNTER — Encounter: Admit: 2020-11-12 | Discharge: 2020-11-12 | Payer: MEDICARE

## 2020-11-12 MED ORDER — TRAMADOL 50 MG PO TAB
50 mg | ORAL_TABLET | ORAL | 0 refills | PRN
Start: 2020-11-12 — End: ?

## 2020-11-13 ENCOUNTER — Encounter: Admit: 2020-11-13 | Discharge: 2020-11-13 | Payer: MEDICARE

## 2020-11-19 ENCOUNTER — Encounter: Admit: 2020-11-19 | Discharge: 2020-11-19 | Payer: MEDICARE

## 2020-11-19 IMAGING — MR CHEST^CHEST -  WITHOUT
18 series · 18 of 18 positions shown · non-contrast
Comparison: none

[Series 2: T1 · sagittal · 5.0mm · 0.51mm/px · 1 of 25 slices shown (1 of 4)]
[im 1/25]
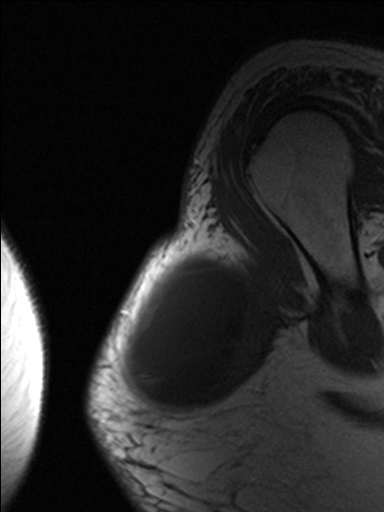

[Series 3: T2 · sagittal · 5.0mm · 0.68mm/px · 1 of 25 slices shown (1 of 2)]
[im 1/25]
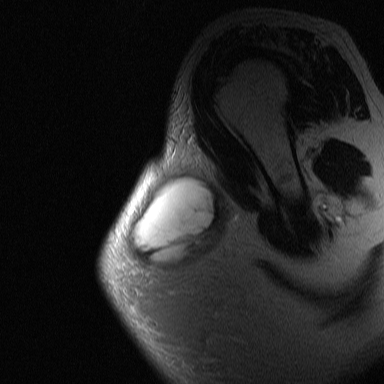

[Series 4: T1 · coronal · 5.0mm · 0.78mm/px · 1 of 19 slices shown (2 of 4)]
[im 1/19]
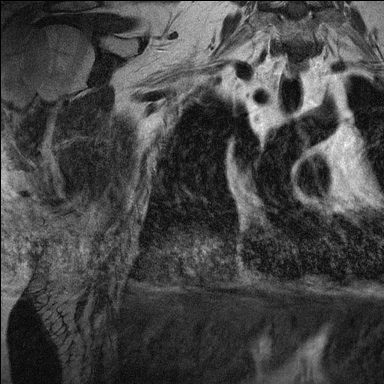

[Series 6: T2 fat-sat · axial · 4.0mm · 0.78mm/px · 1 of 24 slices shown (1 of 5)]
[im 1/24]
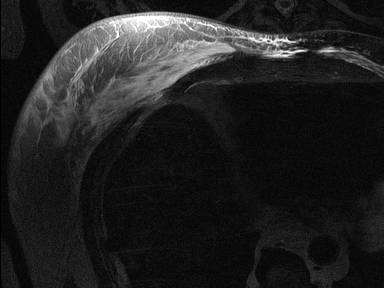

[Series 7: T1 · axial · 4.0mm · 1.17mm/px · 1 of 29 slices shown (3 of 4)]
[im 1/29]
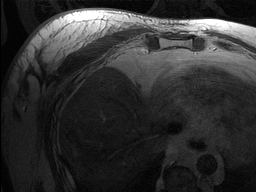

[Series 8: T2 fat-sat · coronal · 4.0mm · 0.65mm/px · 1 of 25 slices shown (2 of 5)]
[im 1/25]
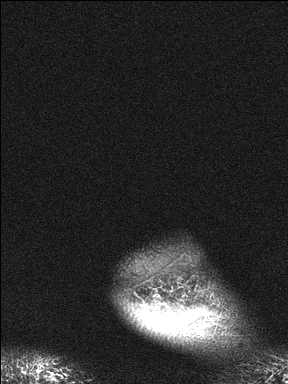

[Series 9: T2 fat-sat · sagittal · 4.0mm · 0.65mm/px · 1 of 26 slices shown (3 of 5)]
[im 1/26]
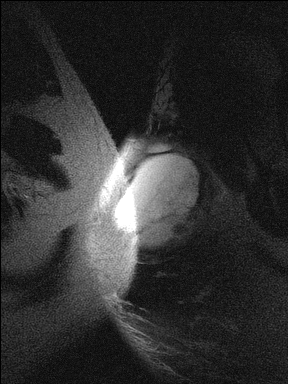

[Series 10: T2 · axial · 5.0mm · 0.68mm/px · 1 of 25 slices shown (2 of 2)]
[im 1/25]
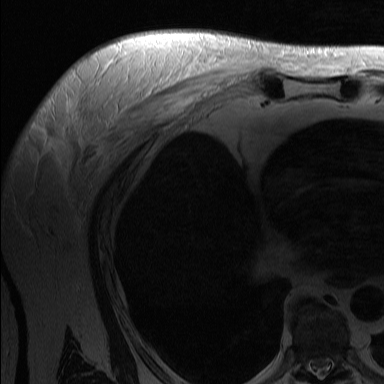

[Series 11: T2 fat-sat · axial · 4.0mm · 0.78mm/px · 1 of 25 slices shown (4 of 5)]
[im 1/25]
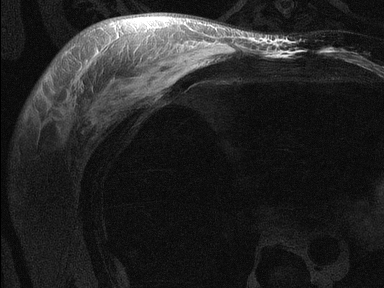

[Series 13: T2 fat-sat · sagittal · 4.0mm · 0.52mm/px · 1 of 28 slices shown (5 of 5)]
[im 1/28]
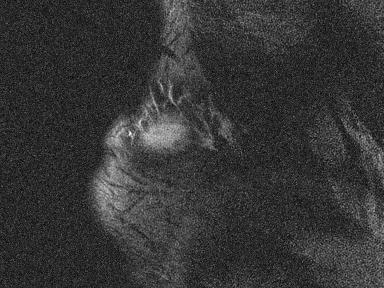

[Series 14: T1 fat-sat · coronal · 5.0mm · 0.73mm/px · 1 of 16 slices shown (1 of 6)]
[im 1/16]
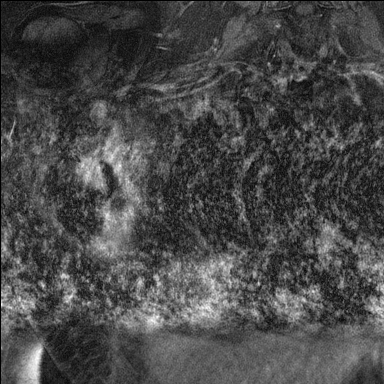

[Series 15: T1 fat-sat · axial · 4.0mm · 1.17mm/px · 1 of 24 slices shown (2 of 6)]
[im 1/24]
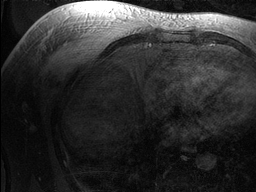

[Series 16: T1 fat-sat · sagittal · 4.0mm · 1.17mm/px · 1 of 25 slices shown (3 of 6)]
[im 1/25]
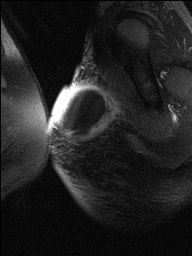

[Series 22: T1 · axial · 4.0mm · 1.56mm/px · 1 of 35 slices shown (4 of 4)]
[im 1/35]
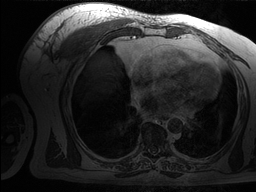

[Series 23: STIR · axial · 5.0mm · 0.78mm/px · 1 of 32 slices shown]
[im 1/32]
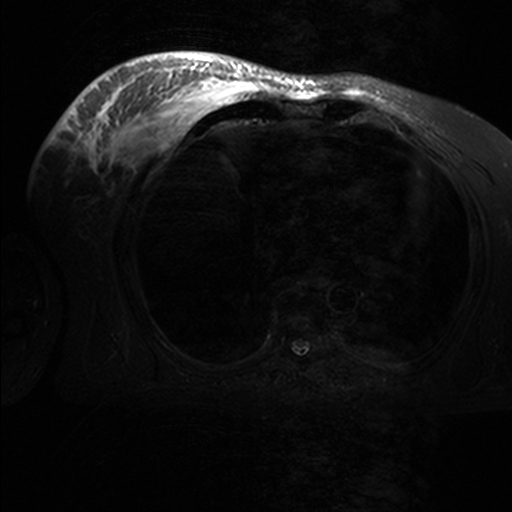

[Series 24: T1 fat-sat · coronal · 5.0mm · 1.04mm/px · 1 of 17 slices shown (4 of 6)]
[im 1/17]
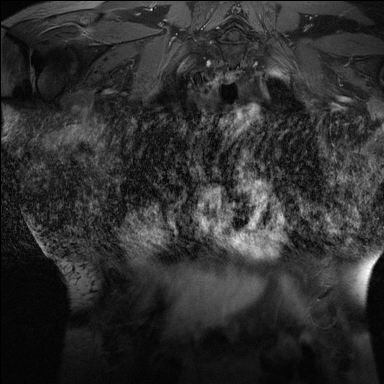

[Series 25: T1 fat-sat · axial · 4.5mm · 1.56mm/px · 1 of 34 slices shown (5 of 6)]
[im 1/34]
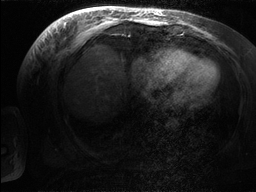

[Series 26: T1 fat-sat · sagittal · 4.5mm · 1.56mm/px · 1 of 34 slices shown (6 of 6)]
[im 1/34]
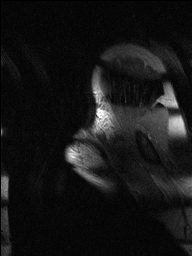

[18 of 18 positions shown; findings below may reference images not displayed]

Ordering:    CARBONE, KATERINA

EXAM

MR chest wo/w con

INDICATION

Soft tissue sarcoma, status post radiation

TECHNIQUE

COMPARISONS

MR of the chest 09/25/2020

FINDINGS

TUMOR:
1. Decreased internal septal nodular enhancement when compared to 09/25/2020
2. Persistent areas of nodular enhancement compatible with viable tumor at the posterior lateral,
posterior, medial and anterior medial to superior margins of the centrally necrotic mass.
3. Mass measures approximately 9 x 14x3 x 12.2 cm, previously 8.1 x 14.1 x 10 cm (remeasured). The
minimal increase in size is favored to be secondary to increased central necrosis related to
treatment change.
4. Increased edema/enhancement along the remainder of the pectoralis major muscle and the
underlying pectoralis minor muscle likely compatible with radiation change. Mild nodularity along
the medial margin of the mass extending into the medial aspect of the pectoralis major muscle, which
may be seen in the setting of muscle invasion versus radiation change (series 24, image 8).
5. The lateral inferior margin of the mass contacts the anterior investing fascia of the anterior
deltoid muscle (series 23, image 15).

BONES: Normal signal in the osseous structures

MUSCLES: No definitive muscle invasion of the pectoralis minor with interval posttreatment change.

NEUROVASCULAR: Intact.

OTHER: Subcutaneous tissue edema of the right anterior chest compatible with radiation change.
Indeterminate right upper paratracheal possible lymph node, 1.5 cm

IMPRESSION
1. Decreased internal septal nodular enhancement when compared to prior examination.
2. Persistent areas of viable tumor at the margins of the lesion with increased central necrosis.
3. Indeterminate edema/enhancement which may be related to posttreatment change along the medial
margin of the lesion extending into the medial aspect of the pectoralis major muscle. Subtle tumor
invasion cannot be excluded. Particular attention to this region at surgery is recommended.
4. Posterior lateral inferior margin of the mass contacts the investing fascia of the anterior
deltoid.
5. Posttreatment change of the pectoralis minor with radiation myositis.
6. Interval development of indeterminate right upper paratracheal lymph node, 1.5 cm. No interval
right axillary lymphadenopathy.

Tech Notes:

FU RIGHT ANTERIOR CHEST WALL SARCOMA, PREV [DATE], PT FINISHED RADIATION [DATE].  15 ML GADAVIST RG

## 2020-11-21 ENCOUNTER — Encounter: Admit: 2020-11-21 | Discharge: 2020-11-21 | Payer: MEDICARE

## 2020-11-21 DIAGNOSIS — C499 Malignant neoplasm of connective and soft tissue, unspecified: Secondary | ICD-10-CM

## 2020-11-22 ENCOUNTER — Encounter: Admit: 2020-11-22 | Discharge: 2020-11-22 | Payer: MEDICARE

## 2020-11-22 DIAGNOSIS — C801 Malignant (primary) neoplasm, unspecified: Secondary | ICD-10-CM

## 2020-11-22 DIAGNOSIS — C499 Malignant neoplasm of connective and soft tissue, unspecified: Secondary | ICD-10-CM

## 2020-11-22 DIAGNOSIS — I10 Essential (primary) hypertension: Secondary | ICD-10-CM

## 2020-11-22 DIAGNOSIS — K219 Gastro-esophageal reflux disease without esophagitis: Secondary | ICD-10-CM

## 2020-11-22 LAB — CBC AND DIFF
ABSOLUTE BASO COUNT: 0 K/UL (ref 0–0.20)
ABSOLUTE EOS COUNT: 0 K/UL (ref 0–0.45)
ABSOLUTE LYMPH COUNT: 0.4 K/UL — ABNORMAL LOW (ref 1.0–4.8)
ABSOLUTE MONO COUNT: 0.9 K/UL — ABNORMAL HIGH (ref 0–0.80)
ABSOLUTE NEUTROPHIL: 4.7 K/UL (ref 1.8–7.0)
BASOPHILS %: 1 % (ref 0–2)
EOSINOPHILS %: 1 % (ref >60)
HEMATOCRIT: 26 % — ABNORMAL LOW (ref 40–50)
HEMOGLOBIN: 9.2 g/dL — ABNORMAL LOW (ref 13.5–16.5)
LYMPHOCYTES %: 7 % — ABNORMAL LOW (ref 24–44)
MCH: 34 pg — ABNORMAL HIGH (ref 26–34)
MCHC: 35 g/dL — ABNORMAL HIGH (ref 32.0–36.0)
MCV: 97 FL (ref 80–100)
MONOCYTES %: 15 % — ABNORMAL HIGH (ref 4–12)
MPV: 7 FL (ref 7–11)
NEUTROPHILS %: 76 % (ref 41–77)
PLATELET COUNT: 216 K/UL (ref 150–400)
RBC COUNT: 2.7 M/UL — ABNORMAL LOW (ref 4.4–5.5)
RDW: 18 % — ABNORMAL HIGH (ref 11–15)
WBC COUNT: 6.2 K/UL (ref 4.5–11.0)

## 2020-11-22 LAB — COMPREHENSIVE METABOLIC PANEL
POTASSIUM: 4.4 MMOL/L (ref 3.5–5.1)
SODIUM: 137 MMOL/L (ref 137–147)

## 2020-11-22 MED ORDER — TRAMADOL 50 MG PO TAB
50 mg | ORAL_TABLET | ORAL | 0 refills | Status: AC | PRN
Start: 2020-11-22 — End: ?

## 2020-11-22 NOTE — Progress Notes
Patient Name: Dan Steele  Date of Birth: 1951-02-29  [facility] Medical Record Number:  [id number]  Date of Service: 11/22/2020      Patient has now completed his neoadjuvant course of radiation to the pectoralis major muscle mass and presents today for discussion with regard to upcoming surgery.  He does feel that the mass has become more bothersome, causing pain at times depending on positioning.  He feels his range of motion has decreased as well.  He does have the expected overlying skin erythema and radiation changes which is diminishing at this time.      ?  Past Medical History:                        Medical History:   Diagnosis Date   ? Acid reflux ?   ? tums prn    ? 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 Hypertension ?   ?  ?  Past Surgical History:                       Surgical History:   Procedure Laterality Date   ? ROBOTIC LOW ANTERIOR RESECTION, FLEXIBLE SIGMOIDOSCOPY, N/A 04/08/2017   ? Performed by Benetta Spar, MD at [facility] OR   ? COLONOSCOPY WITH BIOPSY - FLEXIBLE - POSSIBLE POLYPECTOMY N/A 05/04/2018   ? Performed by Benetta Spar, MD at [facility] OR   ? COLONOSCOPY ? ?   ?  ?  Current Medications:                        Encounter Medications   Medications   ? Omega-3 Acid Ethyl Esters (LOVAZA) 1 gram capsule   ? ? Sig: Take 2,000 mg by mouth.   ? ondansetron HCL (ZOFRAN) 8 mg tablet   ? ? Sig: as Needed.   ? traMADoL (ULTRAM) 50 mg tablet   ? ? Sig: as Needed.   ?  ?  Allergies:                 No Known Allergies  ?  Social History:                   Social History   ?        Tobacco Use   ? Smoking status: Former Smoker   ? ? Packs/day: 0.50   ? ? Years: 10.00   ? ? Pack years: 5.00   ? ? Types: Cigarettes   ? ? Quit date: 29   ? ? Years since quitting: 34.4   ? Smokeless tobacco: Never Used   Substance Use Topics   ? Alcohol use: Yes   ? ? Alcohol/week: 14.0 standard drinks   ? ? Types: 14 Cans of beer per week   ? Drug use: No   ?                 Family History: Family History   Problem Relation Age of Onset   ? Cancer-Breast Mother ?   ? Diabetes Mother ?   ? Cancer Father ?   ? Diabetes Father ?   ?  ?  Review of Systems:   ?  General: No adenopathy or cafe au lait spots. No bleeding diatheses, spontaneous bruising or other bleeding disorders.   Constitutional: No fevers, chills, weight loss, malaise, or other constitutional symptoms.  HEENT: Negative for trouble swallowing, sight issues pertaining to reasons to current visit, hearing problems related to this visit, hemoptysis.   Respiratory: Negative. Negative for cough and shortness of breath. No hemoptysis  Cardiovascular: Negative for chest pain, shortness of breath. or cardiogenic shock symptoms   Gastrointestinal: Negative for nausea, abdominal pain, diarrhea and constipation. No GI distress  Genitourinary: Negative. No hematuria  Endocrine: o night sweats, weight change, polyuria, polydipsia  Skin: Negative. Negative for rash.   Neurological: Negative for dizziness and headaches.   Psychiatric/Behavioral: Negative.   Musculoskeletal: Negative other than that pertaining to current tumor issue.  ?  ?      Physical examination: On examination the mass is quite large, greater than 20 cm but not seemingly affixed to the underlying osseous structures.  The neurovascular structures in the axilla seem to be free as well.  There is no adenopathy palpated.    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  I informed the patient that at this time we should proceed with surgical resection that being a wide en bloc resection of the right chest wall mass inclusive of the entire pectoralis major muscle.  I indicated to him that in him that he would lose his pectoralis and its function however this is a muscle that can be sacrificed for flaps and other reasons and that he should still retain shoulder range of motion and strength.  The cosmetic changes were discussed as well.  I talked him about the nature of that operation as well as risks involved include infection, bleeding, neurovascular compromise and seroma formation indicating that removal of a mass of this size and a posterior radiated field will allow for seroma to take place and therefore I will keep the drain in for quite some time thereafter.    Plan: We will schedule the patient for radical en bloc resection right chest wall mass.  I would like to give this a few more weeks to allow the radiation effects to diminish and proceed thereafter.

## 2020-11-22 NOTE — Progress Notes
Scheduled surgery with patient for Monday 12/17/20, Wide en bloc resection RIGHT pectoralis sarcoma.  Reviewed the date, time and location of surgery.  Surgical procedure, including laterality, were confirmed with patient.  Provided patient with preop instructions including Hibiclens and hygiene instructions. Explained PAT nurse will give further instructions regarding when to stop eating and drinking the night prior to the procedure and specific medications.  Ordered preop labs.  Instructed that patient will return to clinic after surgery for an incision check.  Patient states understanding to these instructions and has no further questions.

## 2020-11-26 ENCOUNTER — Encounter: Admit: 2020-11-26 | Discharge: 2020-11-26 | Payer: MEDICARE

## 2020-11-26 DIAGNOSIS — C499 Malignant neoplasm of connective and soft tissue, unspecified: Secondary | ICD-10-CM

## 2020-11-26 MED ORDER — CEFAZOLIN INJ 1GM IVP
2 g | Freq: Once | INTRAVENOUS | 0 refills
Start: 2020-11-26 — End: ?

## 2020-12-03 ENCOUNTER — Encounter: Admit: 2020-12-03 | Discharge: 2020-12-03 | Payer: MEDICARE

## 2020-12-03 DIAGNOSIS — C801 Malignant (primary) neoplasm, unspecified: Secondary | ICD-10-CM

## 2020-12-03 DIAGNOSIS — I10 Essential (primary) hypertension: Secondary | ICD-10-CM

## 2020-12-03 DIAGNOSIS — K219 Gastro-esophageal reflux disease without esophagitis: Secondary | ICD-10-CM

## 2020-12-04 ENCOUNTER — Encounter: Admit: 2020-12-04 | Discharge: 2020-12-04 | Payer: MEDICARE

## 2020-12-04 DIAGNOSIS — C499 Malignant neoplasm of connective and soft tissue, unspecified: Secondary | ICD-10-CM

## 2020-12-17 ENCOUNTER — Encounter: Admit: 2020-12-17 | Discharge: 2020-12-17 | Payer: MEDICARE

## 2020-12-17 DIAGNOSIS — I10 Essential (primary) hypertension: Secondary | ICD-10-CM

## 2020-12-17 DIAGNOSIS — C801 Malignant (primary) neoplasm, unspecified: Secondary | ICD-10-CM

## 2020-12-17 DIAGNOSIS — K219 Gastro-esophageal reflux disease without esophagitis: Secondary | ICD-10-CM

## 2020-12-17 MED ORDER — FENTANYL CITRATE (PF) 50 MCG/ML IJ SOLN
INTRAVENOUS | 0 refills | Status: DC
Start: 2020-12-17 — End: 2020-12-17
  Administered 2020-12-17: 17:00:00 100 ug via INTRAVENOUS
  Administered 2020-12-17: 17:00:00 50 ug via INTRAVENOUS

## 2020-12-17 MED ORDER — MIDAZOLAM 1 MG/ML IJ SOLN
INTRAVENOUS | 0 refills | Status: DC
Start: 2020-12-17 — End: 2020-12-17
  Administered 2020-12-17: 16:00:00 2 mg via INTRAVENOUS

## 2020-12-17 MED ORDER — EPHEDRINE SULFATE 50 MG/ML IV SOLN
INTRAVENOUS | 0 refills | Status: DC
Start: 2020-12-17 — End: 2020-12-17
  Administered 2020-12-17: 17:00:00 15 mg via INTRAVENOUS

## 2020-12-17 MED ORDER — DEXAMETHASONE SODIUM PHOSPHATE 4 MG/ML IJ SOLN
INTRAVENOUS | 0 refills | Status: DC
Start: 2020-12-17 — End: 2020-12-17
  Administered 2020-12-17: 17:00:00 4 mg via INTRAVENOUS

## 2020-12-17 MED ORDER — LIDOCAINE (PF) 200 MG/10 ML (2 %) IJ SYRG
INTRAVENOUS | 0 refills | Status: DC
Start: 2020-12-17 — End: 2020-12-17
  Administered 2020-12-17: 17:00:00 100 mg via INTRAVENOUS

## 2020-12-17 MED ORDER — SUCCINYLCHOLINE CHLORIDE 20 MG/ML IJ SOLN
INTRAVENOUS | 0 refills | Status: DC
Start: 2020-12-17 — End: 2020-12-17
  Administered 2020-12-17: 17:00:00 100 mg via INTRAVENOUS

## 2020-12-17 MED ORDER — PROPOFOL INJ 10 MG/ML IV VIAL
INTRAVENOUS | 0 refills | Status: DC
Start: 2020-12-17 — End: 2020-12-17
  Administered 2020-12-17: 17:00:00 30 mg via INTRAVENOUS
  Administered 2020-12-17: 17:00:00 200 mg via INTRAVENOUS

## 2020-12-17 MED ORDER — PHENYLEPHRINE HCL IN 0.9% NACL 1 MG/10 ML (100 MCG/ML) IV SYRG
INTRAVENOUS | 0 refills | Status: DC
Start: 2020-12-17 — End: 2020-12-17
  Administered 2020-12-17: 17:00:00 100 ug via INTRAVENOUS

## 2020-12-17 MED ADMIN — OXYCODONE 5 MG PO TAB [10814]: 5 mg | ORAL | @ 23:00:00 | NDC 00904696661

## 2020-12-17 MED ADMIN — FENTANYL CITRATE (PF) 50 MCG/ML IJ SOLN [3037]: 50 ug | INTRAVENOUS | @ 19:00:00 | Stop: 2020-12-17 | NDC 00641602701

## 2020-12-17 MED ADMIN — WATER FOR INJECTION, STERILE IJ SOLN [79513]: 20 mL | INTRAVENOUS | Stop: 2020-12-17 | NDC 63323018508

## 2020-12-17 MED ADMIN — ONDANSETRON HCL (PF) 4 MG/2 ML IJ SOLN [136012]: 4 mg | INTRAVENOUS | @ 18:00:00 | Stop: 2020-12-17 | NDC 00641608001

## 2020-12-17 MED ADMIN — OXYCODONE 5 MG PO TAB [10814]: 10 mg | ORAL | @ 19:00:00 | Stop: 2020-12-17 | NDC 00904696661

## 2020-12-17 MED ADMIN — FENTANYL CITRATE (PF) 50 MCG/ML IJ SOLN [3037]: 50 ug | INTRAVENOUS | @ 18:00:00 | Stop: 2020-12-17 | NDC 00641602701

## 2020-12-17 MED ADMIN — CEFAZOLIN INJ 1GM IVP [210319]: 2 g | INTRAVENOUS | Stop: 2020-12-18 | NDC 60505614200

## 2020-12-17 MED ADMIN — CEFAZOLIN INJ 1GM IVP [210319]: 2 g | INTRAVENOUS | @ 17:00:00 | Stop: 2020-12-17 | NDC 44567070725

## 2020-12-17 MED ADMIN — ASPIRIN 81 MG PO TBEC [14113]: 81 mg | ORAL | @ 23:00:00 | NDC 49483048112

## 2020-12-17 MED ADMIN — ROPIVACAINE (PF) 2 MG/ML (0.2 %) IJ SOLN [79403]: 20 mL | INTRAMUSCULAR | @ 18:00:00 | Stop: 2020-12-17 | NDC 63323028523

## 2020-12-17 MED ADMIN — ACETAMINOPHEN 500 MG PO TAB [102]: 1000 mg | ORAL | @ 16:00:00 | Stop: 2020-12-17 | NDC 00904673061

## 2020-12-17 MED ADMIN — DEXTROSE 5%-0.45% SODIUM CHLORIDE & POTASSIUM CHLORIDE 20 MEQ/L IV SOLP [14863]: 1000 mL | INTRAVENOUS | @ 20:00:00 | Stop: 2020-12-19 | NDC 00338067104

## 2020-12-17 MED ADMIN — LACTATED RINGERS IV SOLP [4318]: 1000.000 mL | INTRAVENOUS | @ 16:00:00 | Stop: 2020-12-17 | NDC 00990795309

## 2020-12-17 MED ADMIN — CELECOXIB 200 MG PO CAP [76958]: 200 mg | ORAL | @ 16:00:00 | Stop: 2020-12-17 | NDC 00904650361

## 2020-12-17 MED ADMIN — KETOROLAC 30 MG/ML (1 ML) IJ SOLN [22473]: 15 mg | INTRAVENOUS | @ 20:00:00 | Stop: 2020-12-22 | NDC 72611072201

## 2020-12-17 NOTE — Anesthesia Pre-Procedure Evaluation
Anesthesia Pre-Procedure Evaluation    Name: Dan Steele      MRN: [id number]     DOB: 15-Nov-1950     Age: 70 y.o.     Sex: male   _________________________________________________________________________     Procedure Info:   Procedure Information     Date/Time: 12/17/20 1128    Procedure: Wide en bloc resection RIGHT pectoralis sarcoma (Right Chest) - **1.5 hrs.    Location: [facility]    Surgeons: Talbert Nan, MD          Physical Assessment  Vital Signs (last filed in past 24 hours):  BP: 129/75 (08/22 0928)  Temp: 36.1 ?C (97 ?F) (08/22 0928)  Pulse: 88 (08/22 0928)  Respirations: 19 PER MINUTE (08/22 0928)  SpO2: 99 % (08/22 0928)  O2 Device: None (Room air) (08/22 0928)  Height: 177.8 cm (5' 10) (08/22 8295)  Weight: 98.7 kg (217 lb 9.5 oz) (08/22 0928)  SpO2 Pulse: 88 (08/22 0928)      Patient History   No Known Allergies     Current Medications    Medication Directions   aspirin EC 81 mg tablet Take 81 mg by mouth daily. Take with food.   hydroCHLOROthiazide (HYDRODIURIL) 12.5 mg capsule Take 12.5 mg by mouth daily.   lisinopril (PRINIVIL, ZESTRIL) 40 mg tablet Take 40 mg by mouth daily.   loperamide (IMODIUM A-D) 2 mg capsule Take 1mg  before dinner.   Omega-3 Acid Ethyl Esters (LOVAZA) 1 gram capsule Take 2,000 mg by mouth.   traMADoL (ULTRAM) 50 mg tablet Take one tablet by mouth every 6 hours as needed.   Vit C-Vit E-Lutein-Min-OM-3 150-30-5-150 mg-unit-mg-mg cap Take 1 capsule by mouth daily.   vitamins, multiple tablet Take 1 tablet by mouth daily.         Review of Systems/Medical History      Patient summary reviewed  Nursing notes reviewed  Pertinent labs reviewed    PONV Screening: Non-smoker and Postoperative opioids  No history of anesthetic complications  No family history of anesthetic complications      Airway - negative        Pulmonary - negative      Not a current smoker        No indications/hx of asthma    no COPD      No sleep apnea      Cardiovascular Exercise tolerance: >4 METS      Beta Blocker therapy: No      Beta blockers within 24 hours: n/a        Hypertension, well controlled      No valvular problems/murmurs      No past MI,       No hx of coronary artery disease      No PTCA      No dysrhythmias      No angina      No dyspnea on exertion      GI/Hepatic/Renal - negative        No GERD,       No hx of liver disease     No renal disease      Neuro/Psych - negative      No seizures      No CVA      Musculoskeletal - negative        No neck pain      Endocrine/Other       No diabetes  No hypothyroidism      Anemia (s/p XRT)      Malignancy (sarcoma right pectoralis; h/o rectal cancer)      Obesity (bmi 31)    Constitution - negative   Physical Exam    Airway Findings      Mallampati: II      TM distance: >3 FB      Neck ROM: full      Mouth opening: good      Airway patency: adequate    Dental Findings: Negative      Cardiovascular Findings: Negative      Rhythm: regular      Rate: normal      No murmur    Pulmonary Findings: Negative      Breath sounds clear to auscultation.    Abdominal Findings:       Obese    Neurological Findings: Negative      Alert and oriented x 3    Constitutional findings: Negative      No acute distress      Well-developed      Well-nourished       Diagnostic Tests  Hematology:   Lab Results   Component Value Date    HGB 9.2 11/22/2020    HCT 26.2 11/22/2020    PLTCT 216 11/22/2020    WBC 6.2 11/22/2020    NEUT 76 11/22/2020    ANC 4.77 11/22/2020    ALC 0.40 11/22/2020    MONA 15 11/22/2020    AMC 0.92 11/22/2020    EOSA 1 11/22/2020    ABC 0.04 11/22/2020    MCV 97.0 11/22/2020    MCH 34.2 11/22/2020    MCHC 35.2 11/22/2020    MPV 7.0 11/22/2020    RDW 18.7 11/22/2020         General Chemistry:   Lab Results   Component Value Date    NA 137 11/22/2020    K 4.4 11/22/2020    CL 100 11/22/2020    CO2 27 11/22/2020    GAP 10 11/22/2020    BUN 13 11/22/2020    CR 0.81 11/22/2020    GLU 128 11/22/2020    CA 8.7 11/22/2020    ALBUMIN 3.7 11/22/2020    OBSCA 1.02 04/08/2017    MG 2.1 04/12/2017    TOTBILI 1.6 11/22/2020      Coagulation: No results found for: PT, PTT, INR      Anesthesia Plan    ASA score: 3   Plan: general  Induction method: intravenous  NPO status: acceptable      Informed Consent  Anesthetic plan and risks discussed with patient.        Plan discussed with: anesthesiologist, CRNA and surgeon/proceduralist.  Comments: (I have discussed the risks/benefits/possible complications of general anesthesia with the patient.  These have included dental/oral injury/tooth loss during airway manipulation, nausea/vomiting, delayed/prolonged emergence, and possible cardiac or pulmonary complications.  The patient expressed understanding and wishes to proceed with a general anesthetic.)

## 2020-12-17 NOTE — Anesthesia Post-Procedure Evaluation
Post-Anesthesia Evaluation    Name: Dan Steele      MRN: [id number]     DOB: Feb 20, 1951     Age: 70 y.o.     Sex: male   __________________________________________________________________________     Procedure Information     Anesthesia Start Date/Time: 12/17/20 1125    Procedure: Wide en bloc resection RIGHT pectoralis sarcoma (Right Chest) - **1.5 hrs.    Location: [facility] / [place] MAIN OR/PERIOP    Surgeons: Juliann Pulse, MD          Post-Anesthesia Vitals  BP: 120/72 (08/22 1400)  Temp: 36.4 C (97.5 F) (08/22 1411)  Pulse: 76 (08/22 1411)  Respirations: 18 PER MINUTE (08/22 1411)  SpO2: 96 % (08/22 1400)  SpO2 Pulse: 74 (08/22 1400)  O2 Device: None (Room air) (08/22 1411)  Height: 177.8 cm ('5\' 10"'$ ) (08/22 0928)   Vitals Value Taken Time   BP 120/72 12/17/20 1400   Temp 36.4 C (97.5 F) 12/17/20 1305   Pulse 75 12/17/20 1400   Respirations 16 PER MINUTE 12/17/20 1400   SpO2 96 % 12/17/20 1400   O2 Device None (Room air) 12/17/20 1400   ABP     ART BP           Post Anesthesia Evaluation Note    Evaluation location: pre/post  Patient participation: recovered; patient participated in evaluation  Level of consciousness: alert  Pain management: adequate    Hydration: normovolemia  Temperature: 36.0C - 38.4C  Airway patency: adequate    Perioperative Events       Post-op nausea and vomiting: no PONV    Postoperative Status  Cardiovascular status: hemodynamically stable  Respiratory status: spontaneous ventilation        Perioperative Events  There were no known complications for this encounter.

## 2020-12-18 ENCOUNTER — Encounter: Admit: 2020-12-18 | Discharge: 2020-12-18 | Payer: MEDICARE

## 2020-12-18 MED ADMIN — FAMOTIDINE 20 MG PO TAB [10011]: 20 mg | ORAL | @ 14:00:00 | Stop: 2020-12-18 | NDC 60687059511

## 2020-12-18 MED ADMIN — OXYCODONE 5 MG PO TAB [10814]: 5 mg | ORAL | @ 15:00:00 | Stop: 2020-12-18 | NDC 00904696661

## 2020-12-18 MED ADMIN — KETOROLAC 30 MG/ML (1 ML) IJ SOLN [22473]: 15 mg | INTRAVENOUS | @ 14:00:00 | Stop: 2020-12-18 | NDC 72266011801

## 2020-12-18 MED ADMIN — CEFAZOLIN INJ 1GM IVP [210319]: 2 g | INTRAVENOUS | @ 08:00:00 | Stop: 2020-12-18 | NDC 60505614200

## 2020-12-18 MED ADMIN — FAMOTIDINE 20 MG PO TAB [10011]: 20 mg | ORAL | @ 02:00:00 | NDC 60687059511

## 2020-12-18 MED ADMIN — ASPIRIN 81 MG PO TBEC [14113]: 81 mg | ORAL | @ 14:00:00 | Stop: 2020-12-18 | NDC 49483048112

## 2020-12-18 MED ADMIN — DOCUSATE SODIUM 100 MG PO CAP [2566]: 100 mg | ORAL | @ 14:00:00 | Stop: 2020-12-18 | NDC 00904718361

## 2020-12-18 MED ADMIN — LISINOPRIL 20 MG PO TAB [4526]: 40 mg | ORAL | @ 14:00:00 | Stop: 2020-12-18 | NDC 00904679961

## 2020-12-18 MED ADMIN — WATER FOR INJECTION, STERILE IJ SOLN [79513]: 20 mL | INTRAVENOUS | @ 08:00:00 | Stop: 2020-12-18 | NDC 63323018508

## 2020-12-18 MED ADMIN — KETOROLAC 30 MG/ML (1 ML) IJ SOLN [22473]: 15 mg | INTRAVENOUS | @ 08:00:00 | Stop: 2020-12-18 | NDC 72611072201

## 2020-12-18 MED ADMIN — KETOROLAC 30 MG/ML (1 ML) IJ SOLN [22473]: 15 mg | INTRAVENOUS | @ 02:00:00 | Stop: 2020-12-22 | NDC 72611072201

## 2020-12-18 MED ADMIN — HYDROCHLOROTHIAZIDE 12.5 MG PO TAB [136518]: 12.5 mg | ORAL | @ 14:00:00 | Stop: 2020-12-18 | NDC 00228282011

## 2020-12-18 MED ADMIN — DEXTROSE 5%-0.45% SODIUM CHLORIDE & POTASSIUM CHLORIDE 20 MEQ/L IV SOLP [14863]: 100 mL | INTRAVENOUS | @ 08:00:00 | Stop: 2020-12-18 | NDC 00338067104

## 2020-12-18 MED FILL — OXYCODONE 5 MG PO TAB: 5 mg | ORAL | 6 days supply | Qty: 36 | Fill #1 | Status: CP

## 2020-12-19 ENCOUNTER — Encounter: Admit: 2020-12-19 | Discharge: 2020-12-19 | Payer: MEDICARE

## 2020-12-19 DIAGNOSIS — I10 Essential (primary) hypertension: Secondary | ICD-10-CM

## 2020-12-19 DIAGNOSIS — K219 Gastro-esophageal reflux disease without esophagitis: Secondary | ICD-10-CM

## 2020-12-19 DIAGNOSIS — C801 Malignant (primary) neoplasm, unspecified: Secondary | ICD-10-CM

## 2020-12-21 ENCOUNTER — Encounter: Admit: 2020-12-21 | Discharge: 2020-12-21 | Payer: MEDICARE

## 2020-12-21 NOTE — Telephone Encounter
I called the patient to check-in and to go over the pathology results.  The patient states he is getting along fairly well and that the pain gets less every day.  He states he was pretty sore the first few days even in his stomach area.  He states he is still having quite a bit of drainage as expected.  He states he is sleeping okay and eating okay.  I informed the patient that the pathology results are complete and then it revealed the margins were all clear and that the tumor was 90% necrotic.  He was pleased to hear that.  He did confirm his Wednesday, 8/31 postoperative appointment and had no other questions at this time.

## 2020-12-26 ENCOUNTER — Encounter: Admit: 2020-12-26 | Discharge: 2020-12-26 | Payer: MEDICARE

## 2020-12-26 DIAGNOSIS — I10 Essential (primary) hypertension: Secondary | ICD-10-CM

## 2020-12-26 DIAGNOSIS — C801 Malignant (primary) neoplasm, unspecified: Secondary | ICD-10-CM

## 2020-12-26 DIAGNOSIS — Z85831 Personal history of malignant neoplasm of soft tissue: Secondary | ICD-10-CM

## 2020-12-26 DIAGNOSIS — Z4801 Encounter for change or removal of surgical wound dressing: Secondary | ICD-10-CM

## 2020-12-26 DIAGNOSIS — Z923 Personal history of irradiation: Secondary | ICD-10-CM

## 2020-12-26 DIAGNOSIS — K219 Gastro-esophageal reflux disease without esophagitis: Secondary | ICD-10-CM

## 2020-12-26 NOTE — Patient Instructions
Keep the incision covered and dry  Continue to measure the drainage and notify our office when drainage is less than 30 cc for 2 consecutive days

## 2020-12-27 ENCOUNTER — Encounter: Admit: 2020-12-27 | Discharge: 2020-12-27 | Payer: MEDICARE

## 2021-01-24 ENCOUNTER — Encounter: Admit: 2021-01-24 | Discharge: 2021-01-24 | Payer: MEDICARE

## 2021-01-24 NOTE — Progress Notes
Patient Name: Dan Steele  Date of Birth: 03/09/1951  [facility] Medical Record Number:  [id number]          DATE OF SERVICE:  01/24/2021    ATTENDING PHYSICIAN:  Talbert Nan, MD.    ?  DATE PROCEDURE DIAGNOSIS    08/08/2020   core needle biopsy ([facility])  high grade spindle cell pleomorphic sarcoma right upper chest    12/17/2020  wide en bloc resection right pectoralis sarcoma 22 cm x 18 cm x 18 cm  Final Diagnosis:    Soft tissue, right pectoralis mass, biopsy:   Undifferentiated pleomorphic sarcoma with treatment associated   changes.  Margins negative.   ?  CHEMOTHERAPY:  Dr. Emmie Niemann Pendurti   ?  RADIATION: Dr. Yvonna Alanis   right chest wall                           5000 cGy                        10/03/2020  through  11/07/2020  ?    INTERVAL HISTORY: [name] presents in follow up in reference to the above referenced procedure, who is now about 1.5 months out from that procedure. The patient is progressing along quite nicely at this time, stating that he is performing activities as to his desires. The patient denies any significant functional deficits at this time other than describing some mild (and expected) peri-wound numbness.  He has been utilizing the extremity in a normal fashion and does feel that he is getting back to his pre-operative functional status. He has not palpated any additional or new nodules or masses since last visit.    Overall doing well with decreasing output from JP drain. Some soreness noted to the right shoulder/pectoralis region. Denies associated fever or chills.     Medical History:   Diagnosis Date   ? Acid reflux     tums prn    ? 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 History of external beam radiation therapy 12/26/2020   ? Hypertension         Surgical History:   Procedure Laterality Date   ? ROBOTIC LOW ANTERIOR RESECTION, FLEXIBLE SIGMOIDOSCOPY, N/A 04/08/2017    Performed by Benetta Spar, MD at [facility] OR   ? COLONOSCOPY WITH BIOPSY - FLEXIBLE - POSSIBLE POLYPECTOMY N/A 05/04/2018    Performed by Benetta Spar, MD at [facility] OR   ? Wide en bloc resection RIGHT pectoralis sarcoma Right 12/17/2020    Performed by Talbert Nan, MD at [facility] OR   ? COLONOSCOPY            Current Outpatient Medications:   ?  aspirin EC 81 mg tablet, Take 81 mg by mouth daily. Take with food., Disp: , Rfl:   ?  hydroCHLOROthiazide (HYDRODIURIL) 12.5 mg capsule, Take 12.5 mg by mouth daily., Disp: , Rfl:   ?  lisinopril (PRINIVIL, ZESTRIL) 40 mg tablet, Take 40 mg by mouth daily., Disp: , Rfl: 1  ?  loperamide (IMODIUM A-D) 2 mg capsule, Take 1mg  before dinner., Disp: 30 capsule, Rfl: 0  ?  Omega-3 Acid Ethyl Esters (LOVAZA) 1 gram capsule, Take 2,000 mg by mouth., Disp: , Rfl:   ?  Vit C-Vit E-Lutein-Min-OM-3 150-30-5-150 mg-unit-mg-mg cap, Take 1 capsule by mouth daily., Disp: , Rfl:   ?  vitamins, multiple tablet, Take 1 tablet by mouth daily., Disp: , Rfl:      REVIEW OF SYSTEMS: No fevers, chills, weight loss or other constitutional disorders. No chest pain, shortness of breath, hemoptysis, bleeding diatheses, or other pulmonary or hematological problems. No adenopathy, unexpected swelling, cardiac, neurologic, or GI problems.    PHYSICAL EXAMINATION:  Blood pressure 127/74, pulse 85, temperature 36.7 ?C (98.1 ?F), temperature source Temporal, resp. rate 8, height 177.8 cm (5' 10), weight 100.7 kg (222 lb), SpO2 98 %.  General:  Awake, alert, and oriented x3, in no acute distress, seated in a chair in exam room.  Musculoskeletal: JP Drain in place with minimal output. Incision is well approximated with no signs of erythema or drainage. Normal contour and alignment. No masses or nodules are appreciated. No adenopathy or wound problems or complications.  No focal areas of tenderness or unexpected swelling. Motor function is 5/5 in all associated groups, sensation is grossly intact.        ASSESSMENT:  The patient is 1.5 months s/p radical resection chest wall mass. Overall he continues to do quite well with progression of function and no signs of local recurrence of disease.    PLAN:  1. The patient is educated as to the natural history of the healing and remodeling process as well as the expectations regarding return to activities in an unlimited fashion. Continuation of exercises and self monitoring were discussed.    2. Patient may progress to all activities as to their desire without limitations or progress to that activity as directed.     3. Will continue to monitor by clinical exam and radiographs. I educated the patient as to the proper follow up and monitoring (staging studies) necessary for this diagnosis.    4. Follow up in 2 months with new Chest CT            Talbert Nan, MD   Professor of Orthopedic Surgery  [facility]

## 2021-01-29 ENCOUNTER — Encounter: Admit: 2021-01-29 | Discharge: 2021-01-29 | Payer: MEDICARE

## 2021-02-04 ENCOUNTER — Encounter: Admit: 2021-02-04 | Discharge: 2021-02-04 | Payer: MEDICARE

## 2021-02-04 DIAGNOSIS — C20 Malignant neoplasm of rectum: Secondary | ICD-10-CM

## 2021-02-15 ENCOUNTER — Encounter: Admit: 2021-02-15 | Discharge: 2021-02-15 | Payer: MEDICARE

## 2021-02-28 ENCOUNTER — Encounter: Admit: 2021-02-28 | Discharge: 2021-02-28 | Payer: MEDICARE

## 2021-03-01 ENCOUNTER — Encounter: Admit: 2021-03-01 | Discharge: 2021-03-01 | Payer: MEDICARE

## 2021-03-01 DIAGNOSIS — R609 Edema, unspecified: Secondary | ICD-10-CM

## 2021-03-01 DIAGNOSIS — Z85831 Personal history of malignant neoplasm of soft tissue: Secondary | ICD-10-CM

## 2021-03-01 DIAGNOSIS — C499 Malignant neoplasm of connective and soft tissue, unspecified: Secondary | ICD-10-CM

## 2021-03-01 DIAGNOSIS — L539 Erythematous condition, unspecified: Secondary | ICD-10-CM

## 2021-03-05 ENCOUNTER — Encounter: Admit: 2021-03-05 | Discharge: 2021-03-05 | Payer: MEDICARE

## 2021-03-05 DIAGNOSIS — Z85831 Personal history of malignant neoplasm of soft tissue: Secondary | ICD-10-CM

## 2021-03-05 DIAGNOSIS — R609 Edema, unspecified: Secondary | ICD-10-CM

## 2021-03-05 DIAGNOSIS — L539 Erythematous condition, unspecified: Secondary | ICD-10-CM

## 2021-03-05 DIAGNOSIS — T148XXA Other injury of unspecified body region, initial encounter: Secondary | ICD-10-CM

## 2021-03-05 NOTE — Telephone Encounter
We received the patient's MRI scan today and it does show a large hematoma in the right pectoralis region as suspected.  I informed him that Dr. Aubery Lapping reviewed the MRI scan and it is his recommendation the patient undergo a CT-guided or ultrasound-guided drain placement.  I informed the patient I would place the order and that he should be getting a call hopefully within the week to get that set up.  I did inform him that it would be an outpatient procedure but that they would numb the skin and give him some medicine that makes him a little sleepy and that he would need a driver.  He voiced understanding, had no other questions at this time, and thanked me for the call.

## 2021-03-05 NOTE — Progress Notes
CBC, Protime INR lab orders faxed to:    [facility] (Attn: Lab)  Fax: [phone]  ph: [phone]

## 2021-03-06 ENCOUNTER — Encounter: Admit: 2021-03-06 | Discharge: 2021-03-06 | Payer: MEDICARE

## 2021-03-06 NOTE — Progress Notes
Interventional Radiology Outpatient Procedure Review Assessment        Reason for consult: Evaluate for chest wall drain palcement in setting of large hematoma right pectoralis region after surgery.      History of present illness:  Dan Steele is a 70 y.o. male patient with a history significant for right chest wall sarcoma.      Assessment:   - Review of imaging: MRI chest external imaging 03/04/21.   - Review of recent labs and allergies: Done.   - Review of anticoagulation medications: None  - Previous ASA score if available: 3 (If ASA IV, anesthesia will run sedation for procedure if sedation indicated).  - Previous IR Anesthetic/Sedation History (if applicable): N/A  - Case discussed with referring Physician (Y/N): N  - Contraindications to procedure: None      Plan:  - This case has been reviewed and approved for chest wall drain palcement  - Patient position and modality: ultrasound, supine  - Intra-procedural Sedation/Medication Plan: Moderate sedation  - Procedure time frame: 10-14 days  - Anticoagulation: Per IR peri procedural anticoagulation management protocol  - Labs: Per IR pre procedural lab protocol  - Additional requests after review: None    Lab/Radiology/Other Diagnostic Tests:  Labs:  No pertinent labs             Eloy End, MD

## 2021-03-08 ENCOUNTER — Encounter: Admit: 2021-03-08 | Discharge: 2021-03-08 | Payer: MEDICARE

## 2021-03-11 NOTE — Progress Notes
Interventional Radiology Outpatient Scheduling Checklist      1.  Name of Procedure(s):   US Guided Right Pectoralis Chest wall drain placed in Post surgical Hematoma    03/06/2021 1:55 PM CST by Earlean Shawl, MD     Approved? Yes   Attending Provider Reviewing the Case: Thomasena Edis   Modality: Ultrasound   Position: Supine   IR Assessment & plan note completed Yes           2.  Date of Procedure:   03/12/2021      3.  Arrival Time:   0900      4.  Procedure Time:  1000      5.  Correct Procedural Room Assignment:  [facility] Rad Room #2      6.  Blood Thinners Triaged and instructed per protocol: Y/N/NA:  NA  Confirmed accurate instructions sent to patient: Y/N:  NA       7.  Procedure Order Verified: Y/N:  Yes      9.  Patient instructed to have a driver: Y/N/NA:  Yes    10.  Patient instructed on NPO status: Y/N/NA:  Yes  0200 and 0800  Confirmed accurate instructions sent to patient: Y/N:  Yes    11.  Specimen needed: Y/N/NA:  NO  Verified Order placed: Y/N:  N/A    12.  Allergies Verified:  Y/N:  Yes    13.  Is there an Iodine Allergy: Y/N:  No  Does the Procedure Require contrast: Y/N:  NO  If so, was the IR- Contrast Allergy Pre-Procedure Medication protocol ordered: Y/NA:  NA    14.  Does the patient have labs according to IR Pre-procedure Laboratory Parameter policy: Y/N/NA:  N/A  If No, was the patient instructed to obtain labs prior to procedure: Y/N/NA:  NA     15.  Will the patient need to be admitted or have a possible admission: Y/N:  No  If yes, confirmed accurate instructions sent to patient: Y/N/NA:  NA     16.  Patient States Understanding: Y/N:  Yes    17.  History of OSA:  Y/N:  No  If yes, confirm request to bring CPAP sent to patient: Y/N/NA:  NA    18. Does the patient have an insulin pump or continuous glucose monitor? Y/N  N/A   If yes, was the patient instructed that this will need to be removed for this procedure and to bring supplies to reapply once the procedure is complete? Y/N    19. Patient was sent electronic procedure instructions: Y/N:  Yes

## 2021-03-11 NOTE — Patient Education
Dear Dan Steele,    Thank you for choosing [facility] Interventional Radiology for your procedure. Your appointment information is listed below:    Appointment Date: 03/12/2021  Appointment Time:   10AM   Arrival Time:        9AM  Location:     ? [facility]: [address], [place], [place] [address]   Parking: available in the front of the building    INTERVENTIONAL RADIOLOGY  PRE-PROCEDURE INSTRUCTIONS SEDATION    You are scheduled for a procedure in Interventional Radiology with procedural sedation.  Please follow these instructions and any direction from your Primary Care/Managing Physician.  If you have questions about your procedure or need to reschedule please call [phone].    Medication Instructions:   You may take the following medications with a small sip of water:  Continue taking your normal morning medications as directed.    Diet Instructions:  a. (8) hours 2AM before your procedure, stop your regular diet and start a clear liquid diet.  b. (6) hours before your procedure, discontinue tube feedings and chewing tobacco.  c. (2) hours 8AM before your procedure discontinue clear liquids.  You should have nothing by mouth. This includes GUM or CANDY  Clear Liquid Diet    Water  Apple or White Grape Juice  Coffee or tea without cream   Tea  White Cranberry Juice  Chicken Bouillon or Broth (no noodles)  Soda Pop  Popsicles   Beef Bouillon or Broth (no noodles)    Day of Exam Instructions:  1. Bathe or shower with an antibacterial soap prior to your appointment.  2. If you have a history of Obstructive Sleep Apnea (OSA) bring your CPAP/BIPAP.   3. Bring a list of your current medications and the dosages.  4. Wear comfortable clothing and leave valuables at home.  5. Arrive (1) hour prior to your appointment.  This time will be spent registering, interviewing, assessing, educating and preparing you for the test.  ? You will be with [place] anywhere from 30 minutes to 6 hours after your exam depending on your procedure.  6. You will be sedated for the procedure. A responsible adult must drive you home (no [employer] Goad, taxis or buses are allowed) and stay with you overnight. If you do not have a driver we will be unable to perform your procedure.   7. You will not be able to return to work or drive the same day if receiving sedation.

## 2021-03-12 ENCOUNTER — Ambulatory Visit: Admit: 2021-03-12 | Discharge: 2021-03-12 | Payer: MEDICARE

## 2021-03-12 ENCOUNTER — Encounter: Admit: 2021-03-12 | Discharge: 2021-03-12 | Payer: MEDICARE

## 2021-03-12 DIAGNOSIS — T148XXA Other injury of unspecified body region, initial encounter: Secondary | ICD-10-CM

## 2021-03-12 DIAGNOSIS — I10 Essential (primary) hypertension: Secondary | ICD-10-CM

## 2021-03-12 DIAGNOSIS — Z923 Personal history of irradiation: Secondary | ICD-10-CM

## 2021-03-12 DIAGNOSIS — C801 Malignant (primary) neoplasm, unspecified: Secondary | ICD-10-CM

## 2021-03-12 DIAGNOSIS — K219 Gastro-esophageal reflux disease without esophagitis: Secondary | ICD-10-CM

## 2021-03-12 MED ORDER — SODIUM CHLORIDE 0.9 % IJ SYRG
10 mL | Freq: Three times a day (TID) | INTRAMUSCULAR | 2 refills | Status: CN
Start: 2021-03-12 — End: ?

## 2021-03-12 MED FILL — SODIUM CHLORIDE 0.9 % IJ SYRG: 0.9 % | INTRAMUSCULAR | 30 days supply | Qty: 900 | Fill #1 | Status: AC

## 2021-03-12 NOTE — Other
Immediate Post Procedure Note    Date:  03/12/2021                                           Attending Physician:   Baldwin Jamaica MD    Procedure(s):  US guided drain placement  Indications:  Fluid collection  Findings:  Yellow tinged fluid aspirate from septate collection followed by 75F drain  Anesthesia: local  Sedation/Medication Plan: Lidocaine    Time out performed: Consent obtained, correct patient verified, correct procedure verified, correct site verified, patient marked as necessary.  Estimated Blood Loss:  None/Negligible  Specimen(s) Removed/Disposition:  Yes, sent to pathology  Complications: None  Comments: Tolerated well    Odis Hollingshead, MD

## 2021-03-12 NOTE — Progress Notes
Patient had drain placement with uresil type drain attached.  Patient given initial dressing supplies and flushes.  Patient did flushing with return demonstration.  He will flush drain BID and as needed.  His brother is here with him today and can assist as needed.  Orders for drain supplies faxed to [facility].  Also a script was sent to the [facility] for flushes if patient desires to pick them up.  He will Contact his surgeon when output is <30 for more than 48hrs.  He also has the number for IR if he needs any assistance.

## 2021-03-12 NOTE — H&P (View-Only)
Pre Procedure History and Physical/Sedation Plan-OP    Procedure Date: 03/12/2021     Planned Procedure(s):  US guided chest wall drain placement    Indication for exam:  Chest wall hematoma evacuation   __________________________________________________________________    Chief Complaint:  Post surgical hematoma    History of Present Illness: Dan Steele is a 70 y.o. male.    Patient Active Problem List    Diagnosis Date Noted   ? History of external beam radiation therapy 12/26/2020   ? 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 Sarcoma (HCC) 12/17/2020   ? 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 Acid reflux     tums prn    ? 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 History of external beam radiation therapy 12/26/2020   ? Hypertension       Surgical History:   Procedure Laterality Date   ? ROBOTIC LOW ANTERIOR RESECTION, FLEXIBLE SIGMOIDOSCOPY, N/A 04/08/2017    Performed by Benetta Spar, MD at [facility] OR   ? COLONOSCOPY WITH BIOPSY - FLEXIBLE - POSSIBLE POLYPECTOMY N/A 05/04/2018    Performed by Benetta Spar, MD at [facility] OR   ? Wide en bloc resection RIGHT pectoralis sarcoma Right 12/17/2020    Performed by Talbert Nan, MD at [facility] OR   ? COLONOSCOPY        Medications Prior to Admission   Medication Sig Dispense Refill Last Dose   ? aspirin EC 81 mg tablet Take 81 mg by mouth daily. Take with food.   03/12/2021   ? hydroCHLOROthiazide (HYDRODIURIL) 12.5 mg capsule Take 12.5 mg by mouth daily.   03/12/2021   ? lisinopril (PRINIVIL, ZESTRIL) 40 mg tablet Take 40 mg by mouth daily.  1 03/12/2021   ? loperamide (IMODIUM A-D) 2 mg capsule Take 1mg  before dinner. 30 capsule 0    ? Omega-3 Acid Ethyl Esters (LOVAZA) 1 gram capsule Take 2,000 mg by mouth.   03/12/2021   ? Vit C-Vit E-Lutein-Min-OM-3 150-30-5-150 mg-unit-mg-mg cap Take 1 capsule by mouth daily.   03/12/2021   ? vitamins, multiple tablet Take 1 tablet by mouth daily. 03/12/2021     No Known Allergies    Social History:   Social History     Tobacco Use   ? Smoking status: Former Smoker     Packs/day: 0.50     Years: 10.00     Pack years: 5.00     Types: Cigarettes     Quit date: 1988     Years since quitting: 34.8   ? Smokeless tobacco: Never Used   Substance Use Topics   ? Alcohol use: Yes     Alcohol/week: 14.0 standard drinks     Types: 14 Cans of beer per week      Family History   Problem Relation Age of Onset   ? Cancer-Breast Mother    ? Diabetes Mother    ? Cancer Father    ? Diabetes Father         Review of Systems  All other systems reviewed and are negative.    Previous Anesthetic/Sedation History:  Reviewed    Code Status: Prior    Physical Exam:  Vital Signs: Last Filed In 24 Hours Vital Signs: 24 Hour Range   Temp: 36.8 ?C (98.2 ?F) (11/15 0910)  Respirations: 12 PER MINUTE (11/15 0910)  SpO2: 99 % (11/15 0910)  SpO2 Pulse: 77 (11/15 0910) Temp:  [36.8 ?C (98.2 ?F)]   Respirations:  [12 PER MINUTE]   SpO2:  [99 %]             General appearance: alert and no distress  Neurologic: Grossly normal, at baseline  Lungs: Nonlabored with normal effort  Abdomen: soft, non-tender.   Extremities: extremities normal,       Airway:  airway assessment performed  Mallampati II (soft palate, uvula, fauces visible)   Anesthesia Classification:  ASA III (A patient with a severe systemic disease that limits activity, but is not incapacitating)  Pre procedure anxiolysis plan: NA  Sedation/Medication Plan: Lidocaine  Personal history of sedation complications: Denies adverse event.   Family history of sedation complications: Denies adverse event.   Medications for Reversal: None  Discussion/Reviews:  Physician has discussed risks and alternatives of this type of sedation and above planned procedures with patient  NPO Status: Acceptable  Pregnancy Status: N/A        Lab/Radiology/Other Diagnostic Tests:  Labs:    Hematology:    Lab Results   Component Value Date    HGB 11.6 12/17/2020 HCT 34.3 12/17/2020    PLTCT 211 12/17/2020    WBC 4.8 12/17/2020    NEUT 76 11/22/2020    ANC 4.77 11/22/2020    ALC 0.40 11/22/2020    MONA 15 11/22/2020    AMC 0.92 11/22/2020    ABC 0.04 11/22/2020    MCV 95.8 12/17/2020    MCHC 34.0 12/17/2020    MPV 8.3 12/17/2020    RDW 16.3 12/17/2020   , Coagulation:  No results found for: PT, PTT, INR and General Chemistry:    Lab Results   Component Value Date    NA 137 11/22/2020    K 4.4 11/22/2020    CL 100 11/22/2020    GAP 10 11/22/2020    BUN 13 11/22/2020    CR 0.81 11/22/2020    GLU 128 11/22/2020    CA 8.7 11/22/2020    ALBUMIN 3.7 11/22/2020    OBSCA 1.02 04/08/2017    MG 2.1 04/12/2017    TOTBILI 1.6 11/22/2020              Philmore Pali, APRN-NP  Pager [phone]

## 2021-03-18 ENCOUNTER — Encounter: Admit: 2021-03-18 | Discharge: 2021-03-18 | Payer: MEDICARE

## 2021-03-19 ENCOUNTER — Encounter: Admit: 2021-03-19 | Discharge: 2021-03-19 | Payer: MEDICARE

## 2021-03-19 ENCOUNTER — Ambulatory Visit: Admit: 2021-03-19 | Discharge: 2021-03-19 | Payer: MEDICARE

## 2021-03-19 DIAGNOSIS — Z85831 Personal history of malignant neoplasm of soft tissue: Secondary | ICD-10-CM

## 2021-03-19 DIAGNOSIS — R918 Other nonspecific abnormal finding of lung field: Secondary | ICD-10-CM

## 2021-03-19 DIAGNOSIS — K219 Gastro-esophageal reflux disease without esophagitis: Secondary | ICD-10-CM

## 2021-03-19 DIAGNOSIS — Z923 Personal history of irradiation: Secondary | ICD-10-CM

## 2021-03-19 DIAGNOSIS — C801 Malignant (primary) neoplasm, unspecified: Secondary | ICD-10-CM

## 2021-03-19 DIAGNOSIS — C499 Malignant neoplasm of connective and soft tissue, unspecified: Secondary | ICD-10-CM

## 2021-03-19 DIAGNOSIS — I10 Essential (primary) hypertension: Secondary | ICD-10-CM

## 2021-03-19 NOTE — Patient Instructions
1.  Keep the drain site covered for a couple more days until the skin heals over.  Then you may shower and do not need to keep it covered anymore.  2.  Continue to work on range of motion exercises with your right arm.

## 2021-03-19 NOTE — Progress Notes
Patient Name: Dan Steele  Date of Birth: 10-Apr-1951  [facility] Medical Record Number:  [id number]        DATE OF SERVICE:  03/19/2021     ATTENDING PHYSICIAN:  Talbert Nan, MD.    ?  ?  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 ?Margins negative.    03/12/2021  ultrasound-guided drain placement right chest wall  hematoma   ?  CHEMOTHERAPY:??Dr. Gopichand Pendurti  none to date (03/19/2021)  ?  RADIATION:?Dr. Yvonna Alanis   right chest wall???????????????????????????5000 cGy????????????????????????10/03/2020 ?through ?11/07/2020      INTERVAL HISTORY: Dan Steele presents in follow up in reference to the above procedure, who is now about 3 months   out from that procedure. The patient has been going to physical therapy and then developed a large hematoma.  On 03/12/2021 he underwent an ultrasound-guided drain placement due to a hematoma.  He called the office yesterday, 11/21, and stated that the drainage was nearly 0.  We asked him to come in today to have the drain removed.  He did undergo a CT scan of the chest today as routine surveillance.  He has not been back to physical therapy although he has been moving his shoulder around some to keep the range of motion.  He does state that there is some tightness in his shoulder region and on his chest wall due to the radiation.     VITAL SIGNS:  Blood pressure 136/81, pulse 82, temperature 36.8 ?C (98.2 ?F), temperature source Temporal, resp. rate 18, height 177.8 cm (5' 10), weight 100.5 kg (221 lb 9.6 oz), SpO2 98 %.  Pain score 0/10    PHYSICAL EXAMINATION:  General:  Alert, and oriented x3, in no acute distress, seated in a chair in exam room.  Musculoskeletal:  The right pectoralis chest wall incision is well healed. Soft tissues are scarred down and radiation changes are noted to the skin.  There are some quite firm areas.  There is still some bluish discoloration of the skin in the dependent position from the surgical site which may be residual blood from the hematoma.  Patient has approximately 120 degrees of abduction and forward flexion.  He does describe some numbness surrounding the surgical site.    RADIOGRAPHS: CT scan of the chest without contrast, patient informed of the results    CT CHEST WO CONTRAST     INDICATION: s/p right pectoralis sarcoma resection. 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 Wide en bloc resection of the right pectoralis sarcoma   12/17/2020.     COMPARISON STUDY: Outside chest CT 09/04/2020.     TECHNIQUE: ?Unenhanced axial images were obtained through the lungs and   upper abdomen. Coronal and sagittal multiplanar reconstructions were also   obtained.     FINDINGS:     Heart and Mediastinum: The thyroid gland is normal. No axillary or   supraclavicular lymphadenopathy. No mediastinal, hilar or retrocrural   lymphadenopathy. Heart is normal. No pericardial effusion. Moderate   coronary artery indicates atherosclerosis. Thoracic aorta is normal   caliber.     Abdomen: Cholelithiasis. Stable mild thickening of the left  adrenal gland   which may reflect adrenal hyperplasia.     Bones and Soft Tissues: Postsurgical changes of right pectoralis mass   resection. Small gas and fluid collection within the surgical bed   measuring approximately 2.1 x 7.0 cm on axial image 52 series 2 with   pigtail drain in place.     Pleura: The pleural spaces are normal.     Lungs and Airways: New bilateral solid pulmonary nodules involving all 5   lobes. Representative lobulated nodule in the left upper lobe measures 1.0   x 1.0 cm on image 33 series 4 and right lower lobe nodule measures 0.5 x   0.7 cm image 76 series 4. Mild linear and reticular opacities along the   anterior subpleural aspect of the right upper and middle lobes.     IMPRESSION     1. New bilateral pulmonary nodules highly suspicious for pulmonary   metastatic disease.   2. No thoracic lymphadenopathy.   3. Fluid collection with small foci of gas in the surgical bed which may   reflect postsurgical hematoma or seroma status post right pectoralis mass   resection. Pigtail drain terminates within the collection. Sterility not   confirmed by imaging.   4. 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     ASSESSMENT:  The patient is 3 months wide en bloc resection undifferentiated pleomorphic sarcoma right pectoralis region.  He is 7 days status post ultrasound-guided drain placement right upper chest wall due to hematoma.   Unfortunately his CT scan of the chest demonstrates new bilateral pulmonary nodules, highly suspicious for pulmonary metastatic disease.     PLAN:    Procedure: Drain Removal    The drain was removed with gentle traction and inspected and found to be intact.  The site was covered with Mepilex dressing.    Drain Location: Right pectoralis    Removal date: 03/19/2021    Complications: None    Removal Reason: Clinically Indicated      1.  I informed the patient about the lung nodules and now the need for further discussion regarding systemic treatment.  I told him that there is chemotherapy for soft tissue sarcoma and there are new immunotherapies that are available.  Dr. Collier Bullock came in to the office visit and further discussed the CT scan of the chest with the patient as well as treatment options.  We would like the patient to see our sarcoma medical oncologist Dr. Sissy Hoff for consultation.  We did inform the patient that Dr. Lorenso Courier just returned from an international meeting on sarcoma treatment.  We informed the patient that if treatment is decided upon perhaps the treatment can be provided locally with Dr. Sissy Hoff working in conjunction with Dr. Donnajean Lopes.        2.   Patient was encouraged to continue gentle range of motion to the right shoulder and arm.  Patient was also informed that he is to keep the drain site covered for couple more days until the drain insertion site heals over.       3.    Patient will return in approximately 3 months for repeat clinical exam and CT scan of the chest.  However, I informed the patient that appointment times may change depending on his treatment.  Also informed the patient to be cognizant of the scans that are ordered so that we do not duplicate scans at different locations, and the patient voiced  understanding.    Parts of this note were created with voice recognition software.  Please excuse any typographical or grammatical errors or feel free to notify my office.  Thank you.

## 2021-03-25 ENCOUNTER — Encounter: Admit: 2021-03-25 | Discharge: 2021-03-25 | Payer: MEDICARE

## 2021-03-25 DIAGNOSIS — C801 Malignant (primary) neoplasm, unspecified: Secondary | ICD-10-CM

## 2021-03-25 DIAGNOSIS — I10 Essential (primary) hypertension: Secondary | ICD-10-CM

## 2021-03-25 DIAGNOSIS — M7989 Other specified soft tissue disorders: Secondary | ICD-10-CM

## 2021-03-25 DIAGNOSIS — R918 Other nonspecific abnormal finding of lung field: Secondary | ICD-10-CM

## 2021-03-25 DIAGNOSIS — Z923 Personal history of irradiation: Secondary | ICD-10-CM

## 2021-03-25 DIAGNOSIS — Z85831 Personal history of malignant neoplasm of soft tissue: Secondary | ICD-10-CM

## 2021-03-25 DIAGNOSIS — C20 Malignant neoplasm of rectum: Secondary | ICD-10-CM

## 2021-03-25 DIAGNOSIS — K219 Gastro-esophageal reflux disease without esophagitis: Secondary | ICD-10-CM

## 2021-03-25 LAB — CBC
HEMATOCRIT: 41 % (ref 40–50)
HEMOGLOBIN: 14 g/dL (ref 13.5–16.5)
MCH: 31 pg (ref 26–34)
MCHC: 34 g/dL (ref 32.0–36.0)
MCV: 91 FL (ref 80–100)
MPV: 8.5 FL (ref 7–11)
PLATELET COUNT: 192 K/UL (ref 150–400)
RBC COUNT: 4.4 M/UL (ref 4.4–5.5)
RDW: 15 % (ref 11–15)
WBC COUNT: 7.6 K/UL (ref 4.5–11.0)

## 2021-03-25 LAB — PROTIME INR (PT)
INR: 0.9 (ref 0.8–1.2)
PROTIME: 10 s (ref 9.5–14.2)

## 2021-03-25 NOTE — Progress Notes
Name: Kalani Sthilaire          MRN: [id number]      DOB: Jan 01, 1951      AGE: 70 y.o.   DATE OF SERVICE: 03/25/2021    Subjective:             Reason for Visit:  New Patient      Cancer Staging  No matching staging information was found for the patient.    Ziare Cryder is a 70 y.o. male.     Current Therapy:    History of Present Illness:  Mr. 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     Mr. Rewerts' history with his sarcoma began when he noticed tender upper right chest mass around March 2022. He presented his PCP and initially received an ultrasound of his chest on 08/03/20 that showed a 4.5 x 8.1 cm heterogeneous mildly vascular mass within the right anterior chest. No pathologically enlarged axillary nodes were seen. He then had an ultrasound guided biopsy of this mass on 08/08/20 at [facility] with outside pathology consistent with malignant sarcomatoid neoplasm.    He was then referred to medical oncologist, Dr. Donnajean Lopes, and received a CT Chest with Contrast 09/04/20 that showed a large heterogeneous central low-density mass in the right anterior chest wall involving the pectoralis major muscle measuring up to approximately 10.0 x 6.9 cm in size with no definite extra-muscular local invasion seen. No diffuse consolidative airspace disease, pleural effusion, or pneumothorax was seen. PET scan was also obtained on 09/04/20 that again showed complex mass involving the right pectoralis major muscle measuring 12.2 x 6.5 x 9.3 cm with avid heterogenous activity with maximum SUV of 19.2. No evidence of extra muscular extension of the chest wall mass was seen. Only other area of increased FDG activity was focal activity in the left lateral thyroid region with possible tiny hypoechoic nodule measuring up to about 6 mm in diameter, maximum SUV of this area was 5.4. He reports having partial thyroidectomy at [facility] with Dr. Thurmond Butts on 03/06/21 and reportedly was told small focus of thyroid cancer was seen in excised tissue (cannot see surgical pathology report at time of appointment)       Mr. Spillers met initially with Dr. Collier Bullock on 09/20/20. MRI Chest wall was recommended and completed 09/25/20 that showed large predominantly peripherally nodular enhancing heterogeneous mass within the right pectoralis major muscle, measuring approximately 8.1 x 14.1 x 9.2 cm. Radiation oncology consult was also recommended for potential neoadjuvant radiation treatments. He received this neoadjuvant IMRT with Dr. Dan Humphreys in [place] from 6/8-7/13/22 with total of 50 Gy received.     Mr. Meister then had wide en bloc resection of this right chest wall mass with Dr. Collier Bullock on 12/17/2020. Pathology from this procedure showed undifferentiated pleomorphic sarcoma with treatment associated changes. Size of tumor excised was 17.1 x 10.5 x 9.4 cm with treatment effect present as percentage of viable tumor was only 10%. Tumor was grade 3, 4 mitoses per mm2. Surgical margins were negative for malignancy and no lymphovascular invasion was seen. No regional lymph nodes were seen with excised sample.    His post-operative course has been complicated by a hematoma requiring  ultrasound-guided drain placement 03/12/21. He then had CT Chest w/o Contrast on 03/19/21 that showed new bilateral pulmonary nodules highly suspicious for pulmonary metastatic disease. Representative lobulated nodule in the left upper lobe measures 1.0 x 1.0 cm on image 33 series 4 and right lower lobe nodule measures 0.5 x 0.7 cm image 76 series 4. No thoracic lymphadenopathy was seen. Some signs of post-radiation fibrosis in right lung were also seen.    When seen today, Mr. Lowers reports feeling well overall. He denies having any focal pain. He reports only slight post-surgical discomfort in right chest wall. He denies having any recent upper respiratory infection or COVID-19 infection. He denies having any recent fevers, chills, shortness of breath, nausea, vomiting, or abdominal pain.     Review of Systems   Constitutional: Negative for chills and fever.   HENT: Negative for sinus pressure and sinus pain.    Eyes: Negative.  Negative for pain and redness.   Respiratory: Negative for cough, chest tightness, shortness of breath and wheezing.    Cardiovascular: Negative for chest pain and palpitations.   Gastrointestinal: Negative for abdominal distention, abdominal pain, constipation, diarrhea, nausea and vomiting.   Endocrine: Negative.    Genitourinary: Negative for dysuria and hematuria.   Musculoskeletal: Negative for arthralgias and myalgias.   Skin: Negative for color change, pallor and rash.   Neurological: Negative for dizziness and light-headedness.   Hematological: Negative for adenopathy.   Psychiatric/Behavioral: Negative for agitation and dysphoric mood.   All other systems reviewed and are negative.        Medical History:   Diagnosis Date   ? Acid reflux     tums prn    ? 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 History of external beam radiation therapy 12/26/2020   ? Hypertension        Surgical History:   Procedure Laterality Date   ? ROBOTIC LOW ANTERIOR RESECTION, FLEXIBLE SIGMOIDOSCOPY, N/A 04/08/2017    Performed by Benetta Spar, MD at [facility] OR   ? COLONOSCOPY WITH BIOPSY - FLEXIBLE - POSSIBLE POLYPECTOMY N/A 05/04/2018    Performed by Benetta Spar, MD at [facility] OR   ? Wide en bloc resection RIGHT pectoralis sarcoma Right 12/17/2020    Performed by Talbert Nan, MD at [facility] OR   ? COLONOSCOPY          Family History   Problem Relation Age of Onset   ? Cancer-Breast Mother    ? Diabetes Mother    ? Cancer Father    ? Diabetes Father         Social History     Socioeconomic History   ? Marital status: Single   Occupational History   ? Occupation: Retired Art therapist   Tobacco Use   ? Smoking status: Former     Packs/day: 0.50     Years: 10.00     Pack years: 5.00     Types: Cigarettes     Quit date: 1988     Years since quitting: 34.9   ? Smokeless tobacco: Never   Vaping Use   ? Vaping Use: Never used   Substance and Sexual Activity   ? Alcohol use: Yes     Alcohol/week: 14.0 standard drinks     Types: 14 Cans of beer per week   ? Drug use: No          Objective:     No Known Allergies        ?  aspirin EC 81 mg tablet Take 81 mg by mouth daily. Take with food.   ? hydroCHLOROthiazide (HYDRODIURIL) 12.5 mg capsule Take 12.5 mg by mouth daily.   ? lisinopril (PRINIVIL, ZESTRIL) 40 mg tablet Take 40 mg by mouth daily.   ? loperamide (IMODIUM A-D) 2 mg capsule Take 1mg  before dinner.   ? Omega-3 Acid Ethyl Esters (LOVAZA) 1 gram capsule Take 2,000 mg by mouth.   ? sodium chloride 0.9 % (flush) 0.9 % syringe Inject 10 mL to area(s) as directed three times daily.   ? Vit C-Vit E-Lutein-Min-OM-3 150-30-5-150 mg-unit-mg-mg cap Take 1 capsule by mouth daily.   ? vitamins, multiple tablet Take 1 tablet by mouth daily.       Vitals:    03/25/21 1413   BP: 136/73   BP Source: Arm, Right Upper   Pulse: 80   Temp: 37 ?C (98.6 ?F)   Resp: 18   SpO2: 100%   TempSrc: Oral   PainSc: Zero   Weight: 99.8 kg (220 lb)   Height: 177.8 cm (5' 10)     Body mass index is 31.57 kg/m?Marland Kitchen     Pain Score: Zero            Pain Addressed:  N/A    Patient Evaluated for a Clinical Trial: No treatment clinical trial available for this patient.     [place] Cooperative Oncology Group performance status is 0, Fully active, able to carry on all pre-disease performance without restriction.Marland Kitchen     Physical Exam  Vitals reviewed.   Constitutional:       General: He is not in acute distress.     Appearance: Normal appearance. He is not ill-appearing.   HENT: Head: Normocephalic and atraumatic.   Eyes:      General: No scleral icterus.     Extraocular Movements: Extraocular movements intact.      Conjunctiva/sclera: Conjunctivae normal.      Pupils: Pupils are equal, round, and reactive to light.   Cardiovascular:      Rate and Rhythm: Normal rate and regular rhythm.      Pulses: Normal pulses.      Heart sounds: Normal heart sounds. No murmur heard.  Pulmonary:      Effort: Pulmonary effort is normal. No respiratory distress.      Breath sounds: Normal breath sounds. No wheezing or rales.   Abdominal:      General: Abdomen is flat. Bowel sounds are normal.      Palpations: Abdomen is soft. There is no mass.      Tenderness: There is no abdominal tenderness.   Musculoskeletal:         General: Normal range of motion.      Cervical back: Normal range of motion and neck supple.   Lymphadenopathy:      Cervical: No cervical adenopathy.   Skin:     General: Skin is warm and dry.   Neurological:      Mental Status: He is alert and oriented to person, place, and time.   Psychiatric:         Mood and Affect: Mood normal.         Behavior: Behavior normal.         Thought Content: Thought content normal.         Judgment: Judgment normal.            Recent Results (from the past 672 hour(s))   MRI CHEST EXTERNAL IMAGING  Collection Time: 03/04/21 11:55 AM    Narrative    This order has been auto finalized and does not contain a result.   IR ABDOMINAL DRAIN PLACEMENT    Collection Time: 03/12/21  9:54 AM    Narrative    Ultrasound-guided drain placement:    Clinical Indication:    Hematoma, chest wall fluid collection    Technique and Findings:    The nature of the procedure, risks, benefits, and expected outcomes were discussed with the patient who then provided informed written and verbal consent.    The patient was placed in a supine position on the interventional table.  Initial scans through the chest demonstrated complex and septated right chest wall  fluid collection. The patient was prepped and draped in the usual sterile manner and the superficial tissues were anesthetized with 2% lidocaine solution. An 18 gauge Seldinger needle was advanced into the fluid collection under ultrasound guidance, with images stored to PACS. Yellow-tinged fluid was aspirated. An Amplatz wire was advanced into the fluid collection and a 10 French pigtail catheter was advanced into the fluid collection. Approximately 10 ml of fluid was aspirated. The pigtail catheter was sutured in place and connected to a J VAC system. The patient tolerated the procedure well and left the department in unchanged condition.    medication: Subcutaneous lidocaine.      Impression    Ultrasound-guided drain placement into right chest wall fluid collection.       Finalized by Carlis Stable, M.D. on 03/12/2021 10:45 AM. Dictated by Carlis Stable, M.D. on 03/12/2021 10:44 AM.     CT CHEST WO CONTRAST    Collection Time: 03/19/21  7:29 AM    Narrative    CT CHEST WO CONTRAST     INDICATION: s/p right pectoralis sarcoma resection. 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 Wide en bloc resection of the right pectoralis sarcoma 12/17/2020.    COMPARISON STUDY: Outside chest CT 09/04/2020.    TECHNIQUE:  Unenhanced axial images were obtained through the lungs and upper abdomen. Coronal and sagittal multiplanar reconstructions were also obtained.    FINDINGS:    Heart and Mediastinum: The thyroid gland is normal. No axillary or supraclavicular lymphadenopathy. No mediastinal, hilar or retrocrural lymphadenopathy. Heart is normal. No pericardial effusion. Moderate coronary artery indicates atherosclerosis. Thoracic aorta is normal caliber.    Abdomen: Cholelithiasis. Stable mild thickening of the left adrenal gland which may reflect adrenal hyperplasia.    Bones and Soft Tissues: Postsurgical changes of right pectoralis mass resection. Small gas and fluid collection within the surgical bed measuring approximately 2.1 x 7.0 cm on axial image 52 series 2 with pigtail drain in place.    Pleura: The pleural spaces are normal.    Lungs and Airways: New bilateral solid pulmonary nodules involving all 5 lobes. Representative lobulated nodule in the left upper lobe measures 1.0 x 1.0 cm on image 33 series 4 and right lower lobe nodule measures 0.5 x 0.7 cm image 76 series 4. Mild linear and reticular opacities along the anterior subpleural aspect of the right upper and middle lobes.      Impression    1. New bilateral pulmonary nodules highly suspicious for pulmonary metastatic disease.  2. No thoracic lymphadenopathy.  3. Fluid collection with small foci of gas in the surgical bed which may reflect postsurgical hematoma or seroma status post right pectoralis mass resection. Pigtail drain terminates within the collection. Sterility not confirmed by  imaging.  4. 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           Finalized by Micki Riley, MD on 03/19/2021 8:36 AM. Dictated by Micki Riley, MD on 03/19/2021 8:13 AM.             Assessment and Plan:  The patient is a 70 y.o. male with the following:    1. Undifferentiated Pleomorphic Sarcoma with Primary Lesion in Right Chest Wall/Pectoralis Major Musculature s/p Wide En Bloc Excision 12/17/20  2. Bilateral Pulmonary Nodules  3. Reported Thyroid Cancer    Reviewed epidemiology, risk factors, staging and prognosis of pleomorphic sarcoma (both of recurrence risk and of survival), then subsequent treatment strategies. Discussed that findings of bilateral pulmonary nodules on recent CT Chest do raise significant concern for potential metastatic disease related to his sarcoma. However, there is some chance these nodules may be related to her history of rectal or thyroid cancer. In addition, explained that these nodules could also be due to non-malignant etiologies such as infection or inflammation. To best determine the etiology of these nodules, explained that biopsy would be most prudent as knowing the pathology of these nodules will have important treatment implications. Will send referral to IR to see if they can feasibly and safely perform imaging-guided biopsy. Explained to Mr. Emig that if IR determines they cannot perform the biopsy, his options include either obtaining repeat CT chest in few months to see if nodules have grown in size or are more amenable to surgery vs. referral to cardiothoracic surgery for surgical biopsy.      Outlined that if review of pathology from the biopsy indicates nodules are consistent with metastatic disease, treatment for his pleomorphic sarcoma would not completely remove/cure his disease. Rather, treatment would be focused on extending life beyond that which would be expected without treatment, diminishing cancer-related symptoms, and delaying the onset of additional cancer-related symptoms. Briefly outlined treatment options including traditional chemotherapy and TKI inhibitors but will plan to have more thorough treatment discussion after obtains biopsy.    Will also request surgical pathology records from his recent thyroidectomy at [facility]. Jomarie Longs. Will communicate these recommendations with his primary oncologist, Dr. Donnajean Lopes.    4. Stage I Rectal Cancer (T2N0) s/p robotic LAR in December 2018  - Follows with Dr. Daphine Deutscher     Will have Mr. Caress return to this clinic after his presumed biopsy to review results and next management steps. Discussed with the patient and all questions fully answered. He will call this clinic if any problems arise.    Patient seen and plan of care fully discussed with Dr. Lorenso Courier.    Verita Lamb, MD  Hematology/Oncology Fellow  PGY-6  Pager: [phone]     ATTESTATION    I personally performed the key portions of the E/M visit, discussed case with resident and concur with resident documentation of history, physical exam, assessment, and treatment plan unless otherwise noted.   With his history, will try and obtain a biopsy of one of these lung nodules to know what we are up against, and then henceforth, have educated conversation about what we could or should do about it, especially if worrisome metastatic disease.  He feels fine, thankfully, for now. Retired. No occupational exposures that he is aware of.      Staff name:  Clint Guy, MD Date:  03/25/2021

## 2021-03-26 ENCOUNTER — Encounter: Admit: 2021-03-26 | Discharge: 2021-03-26 | Payer: MEDICARE

## 2021-03-28 ENCOUNTER — Encounter: Admit: 2021-03-28 | Discharge: 2021-03-28 | Payer: MEDICARE

## 2021-03-28 NOTE — Progress Notes
Interventional Radiology Outpatient Procedure Review Assessment        Reason for consult: Evaluate for biopsy in setting of multiple lung nodules.      History of present illness:  Dan Steele is a 70 y.o. male patient with a history significant for thyroid cancer, sarcoma and rectal cancer w/ lung nodules.      Assessment:   - Review of imaging: CT 03/19/21.   - Review of recent labs and allergies: reviewed. If none available, is this information necessary prior to procedure: no.  - Review of anticoagulation medications: none  - Previous ASA score if available: III (If ASA IV, anesthesia will run sedation for procedure if sedation indicated).  - Previous IR Anesthetic/Sedation History (if applicable): unknown  - Case discussed with referring Physician (Y/N): no  - Contraindications to procedure: none      Plan:  - This case has been reviewed and approved for biopsy  - Patient position and modality: supine CT  - Intra-procedural Sedation/Medication Plan: Moderate sedation  - Procedure time frame:  10-14 days  - Anticoagulation: Per IR peri procedural anticoagulation management protocol  - Labs: Per IR pre procedural lab protocol  - Additional requests after review: none    Lab/Radiology/Other Diagnostic Tests:  Labs:  Pertinent labs reviewed             [place] , DO

## 2021-03-28 NOTE — Telephone Encounter
After chart review and concern for pulmonary mets from sarcoma called Dan Steele to check in on symptoms. He is now 32yrs out from [facility] for an early stage rectal cancer. He is having no changes in bowel movements or bleeding in his stool. He has colonoscopy arranged locally in January. Offered to drop CRS to back burner as sarcoma is now the priority. He agrees, will cancel appointments. He will get CEA drawn next time Dr. Prince Rome draws labs. CEA from Dr. Hassell Done is still active. Dan Steele knows he can call us at any time with questions or concerns. I did tell him if he is on chemotherapy they likely will not do the planned colonoscopy and that is ok.     [name]. Kae Heller MSN, APRN, AGCNS-BC, AGPCNP-BC  Advanced Practice Provider  Colon and Rectal Surgery  Surgical Oncology  APP for Dr. Cena Benton, Dr. Hassell Done and Dr. Bethanie Dicker  Pager: [phone]  Available via Hetty [place] and [place]

## 2021-03-29 ENCOUNTER — Encounter: Admit: 2021-03-29 | Discharge: 2021-03-29 | Payer: MEDICARE

## 2021-03-29 NOTE — Patient Education
Dear Dan Steele,    Thank you for choosing [facility] Interventional Radiology for your procedure. Your appointment information is listed below:    Appointment Date: 04/02/21  Appointment Time: 9AM  Arrival Time: 8AM  Location:     ? Main Campus: [address], [place], [place]  [address]  Parking: [address]age  Walk through [facility] Lobby to [employer]  Take Elevators to Second Floor  Follow Signs to Interventional Radiology     INTERVENTIONAL RADIOLOGY  PRE-PROCEDURE INSTRUCTIONS SEDATION    You are scheduled for a procedure in Interventional Radiology with procedural sedation.  Please follow these instructions and any direction from your Primary Care/Managing Physician.  If you have questions about your procedure or need to reschedule please call [phone].    Medication Instructions:   You may take the following medications with a small sip of water:  Continue taking your normal morning medications as directed.        Diet Instructions:  a. (8) hours 1AM before your procedure, stop your regular diet and start a clear liquid diet.  b. (6) hours before your procedure, discontinue tube feedings and chewing tobacco.  c. (2) hours 7AM before your procedure discontinue clear liquids.  You should have nothing by mouth. This includes GUM or CANDY.     Clear Liquid Diet    Water  Apple or White Grape Juice  Coffee or tea without cream   Tea  White Cranberry Juice  Chicken Bouillon or Broth (no noodles)  Soda Pop  Popsicles   Beef Bouillon or Broth (no noodles)    Day of Exam Instructions:  1. Bathe or shower with an antibacterial soap prior to your appointment.  2. If you have a history of Obstructive Sleep Apnea (OSA) bring your CPAP/BIPAP.   3. Bring a list of your current medications and the dosages.  4. Wear comfortable clothing and leave valuables at home.  5. Arrive (1) hour prior to your appointment.  This time will be spent registering, interviewing, assessing, educating and preparing you for the test.  ? You will be with [place] anywhere from 30 minutes to 6 hours after your exam depending on your procedure. Pack a bag for overnight admission but leave it in your vehicle.  6. You will be sedated for the procedure. A responsible adult must drive you home (no Benedetto Goad, taxis or buses are allowed) and stay with you overnight. If you do not have a driver we will be unable to perform your procedure.   7. You will not be able to return to work or drive the same day if receiving sedation.

## 2021-03-29 NOTE — Progress Notes
Interventional Radiology Outpatient Scheduling Checklist      1.  Name of Procedure(s):   Lung nodule biopsy IR MD discretion  Protocol History    03/28/2021 3:18 PM CST by Soutas, [place], DO     Approved? Yes   Attending Provider Reviewing the Case: C[place]   Case Type: Body   Modality: CT   Position: Supine   Sedation Type: Moderate   Anticoagulation Plan: OK to hold   Physician: Any   Recommended Specimen Core   IR Assessment & plan note completed Yes   Comments 03/28/21 TOC recommends proceeding with IR biopsy. GFioreRN         2.  Date of Procedure:   04/02/21      3.  Arrival Time:   0800      4.  Procedure Time:  0900      5.  Correct Procedural Room Assignment:  BHIR rm 5 CT      6.  Blood Thinners Triaged and instructed per protocol: Y/N/NA:  NA  Confirmed accurate instructions sent to patient: Y/N:  NA       7.  Procedure Order Verified: Y/N:  Yes      9.  Patient instructed to have a driver: Y/N/NA:  Yes    10.  Patient instructed on NPO status: Y/N/NA:  Yes  Confirmed accurate instructions sent to patient: Y/N:  Yes    11.  Specimen needed: Y/N/NA:  Yes   Verified Order placed: Y/N:  Yes    12.  Allergies Verified:  Y/N:  Yes    13.  Is there an Iodine Allergy: Y/N:  No  Does the Procedure Require contrast: Y/N:  ?  If so, was the IR- Contrast Allergy Pre-Procedure Medication protocol ordered: Y/NA:  NA    14.  Does the patient have labs according to IR Pre-procedure Laboratory Parameter policy: Y/N/NA:  Yes  If No, was the patient instructed to obtain labs prior to procedure: Y/N/NA:  NA     15.  Will the patient need to be admitted or have a possible admission: Y/N:  There is a chance if pneumothorax  If yes, confirmed accurate instructions sent to patient: Y/N/NA:  NA     16.  Patient States Understanding: Y/N:  Yes    17.  History of OSA:  Y/N:  No  If yes, confirm request to bring CPAP sent to patient: Y/N/NA:  NA    18. Does the patient have an insulin pump or continuous glucose monitor? NA   If yes, was the patient instructed that this will need to be removed for this procedure and to bring supplies to reapply once the procedure is complete? Y/N    19. Patient was sent electronic procedure instructions: Y/N:  Yes

## 2021-04-01 ENCOUNTER — Encounter: Admit: 2021-04-01 | Discharge: 2021-04-01 | Payer: MEDICARE

## 2021-04-02 ENCOUNTER — Encounter: Admit: 2021-04-02 | Discharge: 2021-04-02 | Payer: MEDICARE

## 2021-04-02 ENCOUNTER — Ambulatory Visit: Admit: 2021-04-02 | Discharge: 2021-04-02 | Payer: MEDICARE

## 2021-04-02 DIAGNOSIS — R918 Other nonspecific abnormal finding of lung field: Secondary | ICD-10-CM

## 2021-04-02 DIAGNOSIS — C20 Malignant neoplasm of rectum: Secondary | ICD-10-CM

## 2021-04-02 DIAGNOSIS — Z85831 Personal history of malignant neoplasm of soft tissue: Secondary | ICD-10-CM

## 2021-04-02 MED ORDER — MIDAZOLAM 1 MG/ML IJ SOLN
1 mg | Freq: Once | INTRAVENOUS | 0 refills | Status: CP
Start: 2021-04-02 — End: ?
  Administered 2021-04-02: 15:00:00 1 mg via INTRAVENOUS

## 2021-04-02 MED ORDER — MIDAZOLAM 1 MG/ML IJ SOLN
0 refills | Status: CP
Start: 2021-04-02 — End: ?
  Administered 2021-04-02: 15:00:00 1 mg via INTRAVENOUS

## 2021-04-02 MED ORDER — SODIUM CHLORIDE 0.9 % IV SOLP
0 refills | Status: DC
Start: 2021-04-02 — End: 2021-04-02
  Administered 2021-04-02: 15:00:00 500 mL via INTRAVENOUS

## 2021-04-02 MED ORDER — FENTANYL CITRATE (PF) 50 MCG/ML IJ SOLN
0 refills | Status: CP
Start: 2021-04-02 — End: ?
  Administered 2021-04-02: 15:00:00 50 ug via INTRAVENOUS

## 2021-04-02 NOTE — Progress Notes
Dr Donnamarie Poag read the 1hr post lung biopsy cxr and determined that there was no pneumothorax at this time.  Will continue to monitor.

## 2021-04-02 NOTE — Other
Immediate Post Procedure Note    Date:  04/02/2021                                         Attending Physician:   Dr. Theda Sers  Performing Provider:  Einar Grad. , MD    Consent:  Consent obtained from patient.  Time out performed: Consent obtained, correct patient verified, correct procedure verified, correct site verified, patient marked as necessary.  Pre/Post Procedure Diagnosis:  Lung nodules  Indications:  LUL lung nodule      Procedure(s):  CT guided lung nodule biopsy  Findings:  Successful LUL lung nodule biopsy with 3 core samples obtained.      Estimated Blood Loss:  None/Negligible  Specimen(s) Removed/Disposition:  Yes, sent to pathology  Complications: None  Patient Tolerated Procedure: Well  Post-Procedure Condition:  stable     L. , MD

## 2021-04-02 NOTE — Progress Notes
Dr. Sudie Grumbling read the immediate post lung biopsy CXR and determined that there was no pnuemothorax at this time.  Will continue to monitor.

## 2021-04-02 NOTE — H&P (View-Only)
Pre Procedure History and Physical/Sedation Plan-OP    Procedure Date: 04/02/2021     Planned Procedure(s):  CT guided lung mass biopsy     Indication for exam:  Bilateral pulmonary nodules; hx of rectal cancer   __________________________________________________________________    Chief Complaint:  Lung mass     History of Present Illness: Dan Steele is a 70 y.o. male with PMH of rectal cancer and sarcoma. He presents to IR Today for scheduled lung mass biopsy. He denies acute complaints and is agreeable to procedure.     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     History of external beam radiation therapy 12/26/2020    Hypertension       Surgical History:   Procedure Laterality Date    ROBOTIC LOW ANTERIOR RESECTION, FLEXIBLE SIGMOIDOSCOPY, N/A 04/08/2017    Performed by Benetta Spar, MD at [facility] OR    COLONOSCOPY WITH BIOPSY - FLEXIBLE - POSSIBLE POLYPECTOMY N/A 05/04/2018    Performed by Benetta Spar, MD at [facility] OR    Wide en bloc resection RIGHT pectoralis sarcoma Right 12/17/2020    Performed by Talbert Nan, MD at [facility] OR    COLONOSCOPY        Medications Prior to Admission   Medication Sig Dispense Refill Last Dose    aspirin EC 81 mg tablet Take 81 mg by mouth daily. Take with food.       hydroCHLOROthiazide (HYDRODIURIL) 12.5 mg capsule Take 12.5 mg by mouth daily.       lisinopril (PRINIVIL, ZESTRIL) 40 mg tablet Take 40 mg by mouth daily.  1     loperamide (IMODIUM A-D) 2 mg capsule Take 1mg  before dinner. 30 capsule 0     Omega-3 Acid Ethyl Esters (LOVAZA) 1 gram capsule Take 2,000 mg by mouth.       sodium chloride 0.9 % (flush) 0.9 % syringe Inject 10 mL to area(s) as directed three times daily. 900 mL 2     Vit C-Vit E-Lutein-Min-OM-3 150-30-5-150 mg-unit-mg-mg cap Take 1 capsule by mouth daily.       vitamins, multiple tablet Take 1 tablet by mouth daily.        No Known Allergies    Social History:   Social History     Tobacco Use    Smoking status: Former     Packs/day: 0.50     Years: 10.00     Pack years: 5.00     Types: Cigarettes     Quit date: 1988     Years since quitting: 34.9    Smokeless tobacco: Never   Substance Use Topics    Alcohol use: Yes     Alcohol/week: 14.0 standard drinks     Types: 14 Cans of beer per week      Family History   Problem Relation Age of Onset    Cancer-Breast Mother     Diabetes Mother     Cancer Father  Diabetes Father         Review of Systems  Constitutional: negative  Ears, nose, mouth, throat, and face: negative  Respiratory: negative  Cardiovascular: negative  Gastrointestinal: negative  Musculoskeletal:negative  Neurological: negative  Behavioral/Psych: negative      Previous Anesthetic/Sedation History:  Reviewed     Code Status: Prior    Physical Exam:  Vital Signs: Last Filed In 24 Hours Vital Signs: 24 Hour Range                  General appearance: alert and no distress  Neurologic: Grossly normal, at baseline  Lungs: Nonlabored with normal effort  Abdomen: soft, non-tender.   Extremities: extremities normal, atraumatic, no cyanosis or edema        Airway:  airway assessment performed  Mallampati II (soft palate, uvula, fauces visible)   Anesthesia Classification:  ASA III (A patient with a severe systemic disease that limits activity, but is not incapacitating)  Pre procedure anxiolysis plan: Midazolam  Sedation/Medication Plan: Fentanyl, Lidocaine and Midazolam  Personal history of sedation complications: Denies adverse event.   Family history of sedation complications: Denies adverse event.   Medications for Reversal: Naloxone and Flumazenil  Discussion/Reviews:  Physician has discussed risks and alternatives of this type of sedation and above planned procedures with patient  NPO Status: Acceptable  Pregnancy Status: N/A        Lab/Radiology/Other Diagnostic Tests:  Labs:  Pertinent labs reviewed           Kathleen Lime, APRN-NP  Pager [phone]

## 2021-04-02 NOTE — Progress Notes
Sedation physician present in room. Recent vitals and patient condition reviewed between sedating physician and nurse. Reassessment completed. Determination made to proceed with planned sedation.

## 2021-04-05 ENCOUNTER — Encounter: Admit: 2021-04-05 | Discharge: 2021-04-05 | Payer: MEDICARE

## 2021-04-05 NOTE — Telephone Encounter
NGS and PDL-1 ordered through [facility] for specimen [phone] left lung mass 04/02/2021  [id number]

## 2021-04-08 ENCOUNTER — Encounter: Admit: 2021-04-08 | Discharge: 2021-04-08 | Payer: MEDICARE

## 2021-04-15 ENCOUNTER — Encounter: Admit: 2021-04-15 | Discharge: 2021-04-15 | Payer: MEDICARE

## 2021-04-15 NOTE — Telephone Encounter
Richardson Landry calling regarding some confusion regarding a phone call about genetic testing. He states his brother was called about this.    Drucilla Chalet RN at [facility] [phone].  Angie states that billing department had contacted patient about needing ABN signed. Ensured [facility] had patient's correct phone number [phone].  Angie will have billing reach out to [facility] regarding and any questions he has.    Called [facility] back and left message that [facility] would be contacting him

## 2021-04-24 ENCOUNTER — Encounter: Admit: 2021-04-24 | Discharge: 2021-04-24 | Payer: MEDICARE

## 2021-04-24 NOTE — Telephone Encounter
Called patient because NGS testing through [facility] is on hold because they have not received ABN. Richardson Landry states that they were to send him the forms, but he has no received. Patient has no home computer to print forms so I offered to mail form, he can return to [place] and we will fax ABN to [facility] upon receipt.   ABN mailed to patient.

## 2021-05-02 ENCOUNTER — Encounter: Admit: 2021-05-02 | Discharge: 2021-05-02 | Payer: MEDICARE

## 2021-05-02 NOTE — Telephone Encounter
Patient calling to inform that ABN was received from [facility], patient has returned form to them and they will proceed with testing at this time.

## 2021-05-14 ENCOUNTER — Encounter: Admit: 2021-05-14 | Discharge: 2021-05-14 | Payer: MEDICARE

## 2021-05-14 NOTE — Telephone Encounter
Received the following email from Julious Oka at [facility]:  "[facility] [phone] testing is still in progress; however, a repeat in testing is needed.  Please note that there could be up to 5 business day delay in results."

## 2021-05-21 ENCOUNTER — Encounter: Admit: 2021-05-21 | Discharge: 2021-05-21 | Payer: MEDICARE

## 2021-05-21 DIAGNOSIS — Z923 Personal history of irradiation: Secondary | ICD-10-CM

## 2021-05-21 DIAGNOSIS — C801 Malignant (primary) neoplasm, unspecified: Secondary | ICD-10-CM

## 2021-05-21 DIAGNOSIS — R918 Other nonspecific abnormal finding of lung field: Secondary | ICD-10-CM

## 2021-05-21 DIAGNOSIS — K219 Gastro-esophageal reflux disease without esophagitis: Secondary | ICD-10-CM

## 2021-05-21 DIAGNOSIS — I10 Essential (primary) hypertension: Secondary | ICD-10-CM

## 2021-05-21 DIAGNOSIS — Z85831 Personal history of malignant neoplasm of soft tissue: Secondary | ICD-10-CM

## 2021-06-20 ENCOUNTER — Encounter: Admit: 2021-06-20 | Discharge: 2021-06-20 | Payer: MEDICARE

## 2021-06-20 ENCOUNTER — Ambulatory Visit: Admit: 2021-06-20 | Discharge: 2021-06-20 | Payer: MEDICARE

## 2021-06-20 DIAGNOSIS — Z85831 Personal history of malignant neoplasm of soft tissue: Secondary | ICD-10-CM

## 2021-06-20 DIAGNOSIS — R918 Other nonspecific abnormal finding of lung field: Secondary | ICD-10-CM

## 2021-06-20 DIAGNOSIS — K219 Gastro-esophageal reflux disease without esophagitis: Secondary | ICD-10-CM

## 2021-06-20 DIAGNOSIS — C801 Malignant (primary) neoplasm, unspecified: Secondary | ICD-10-CM

## 2021-06-20 DIAGNOSIS — I10 Essential (primary) hypertension: Secondary | ICD-10-CM

## 2021-06-20 DIAGNOSIS — Z923 Personal history of irradiation: Secondary | ICD-10-CM

## 2021-06-20 NOTE — Progress Notes
Patient Name: Dan Steele  Date of Birth: 1950/11/21  [facility] Medical Record Number:  [id number]          DATE OF SERVICE:  06/20/2021    ATTENDING PHYSICIAN:  Talbert Nan, MD.    ?  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 ?Margins negative.    03/12/2021  ultrasound-guided drain placement right chest wall  hematoma   ?  CHEMOTHERAPY:??Dr. Gopichand Pendurti    none to date (03/19/2021)  ?  RADIATION:?Dr. Yvonna Alanis   right chest wall???????????????????????????5000 cGy????????????????????????10/03/2020 ?through ?11/07/2020  ?    INTERVAL HISTORY: Dan Steele presents in follow up in reference to the above referenced procedure, who is now about 6 months out from that procedure. The patient is progressing along quite nicely at this time, stating that he is performing activities as to his desires. The patient denies any significant functional deficits at this time other than describing some mild (and expected) peri-wound numbness and stiffness.  He has been utilizing the right upper extremity in a normal fashion He has not palpated any additional or new nodules or masses since last visit.  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 Lorenso Courier however unfortunately we really do not have any adjuvant treatments that can be useful in this regard.    Medical History:   Diagnosis Date   ? Acid reflux     tums prn    ? 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 History of external beam radiation therapy 12/26/2020   ? Hypertension         Surgical History:   Procedure Laterality Date   ? ROBOTIC LOW ANTERIOR RESECTION, FLEXIBLE SIGMOIDOSCOPY, N/A 04/08/2017    Performed by Benetta Spar, MD at [facility] OR   ? COLONOSCOPY WITH BIOPSY - FLEXIBLE - POSSIBLE POLYPECTOMY N/A 05/04/2018    Performed by Benetta Spar[facility] MD at [facility] OR   ? Wide en bloc resection RIGHT pectoralis sarcoma Right 12/17/2020    Performed by Talbert Nan, MD at [facility] OR   ? COLONOSCOPY            Current Outpatient Medications:   ?  aspirin EC 81 mg tablet, Take one tablet by mouth daily. Take with food., Disp: , Rfl:   ?  hydroCHLOROthiazide (HYDRODIURIL) 12.5 mg capsule, Take one capsule by mouth daily., Disp: , Rfl:   ?  lisinopril (PRINIVIL, ZESTRIL) 40 mg tablet, Take one tablet by mouth daily., Disp: , Rfl: 1  ?  loperamide (IMODIUM A-D) 2 mg capsule, Take 1mg  before dinner., Disp: 30 capsule, Rfl: 0  ?  Omega-3 Acid Ethyl Esters (LOVAZA) 1 gram capsule, Take two capsules by mouth., Disp: , Rfl:   ?  sodium chloride 0.9 % (flush) 0.9 % syringe, Inject 10 mL to area(s) as directed three times daily., Disp: 900 mL, Rfl: 2  ?  Vit C-Vit E-Lutein-Min-OM-3 150-30-5-150 mg-unit-mg-mg cap, Take 1 capsule by mouth daily., Disp: , Rfl:   ?  vitamins, multiple tablet, Take one tablet by mouth daily., Disp: , Rfl:      REVIEW OF SYSTEMS: No fevers,  chills, weight loss or other constitutional disorders. No chest pain, shortness of breath, hemoptysis, bleeding diatheses, or other pulmonary or hematological problems. No adenopathy, unexpected swelling, cardiac, neurologic, or GI problems.    PHYSICAL EXAMINATION:  Blood pressure 136/68, pulse 85, temperature 36.3 ?C (97.3 ?F), resp. rate 10, weight 103.4 kg (228 lb), SpO2 99 %.  General:  Awake, alert, and oriented x3, in no acute distress, seated in a chair in exam room.  Musculoskeletal:  The wound is well healed. Normal contour and alignment. No masses or nodules are appreciated. No adenopathy or wound problems or complications.  No focal areas of tenderness or unexpected swelling. Motor function is 5/5 in all associated groups, sensation is grossly intact.    RADIOGRAPHS:        ASSESSMENT:  The patient is 6 months s/p resection of the pectoralis major muscle mass.. Overall he continues to do quite well with progression of function and no signs of local recurrence of disease.  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  This is not causing him any symptoms at this point but is progressive in nature.  The patient will follow-up with Dr. Lorenso Courier soon who has discussed with him possibility of chemotherapy.    PLAN:  1. The patient will follow-up with Dr. Lorenso Courier who will also receive the results of the CT scan.    2. Patient may progress to all activities as to their desire without limitations or progress to that activity as directed.     3. Will continue to monitor by clinical exam and radiographs. I educated the patient as to the proper follow up and monitoring (staging studies) necessary for this diagnosis.            Talbert Nan, MD   Professor of Orthopedic Surgery  [facility]

## 2021-06-21 ENCOUNTER — Encounter: Admit: 2021-06-21 | Discharge: 2021-06-21 | Payer: MEDICARE

## 2021-06-21 DIAGNOSIS — R918 Other nonspecific abnormal finding of lung field: Secondary | ICD-10-CM

## 2021-06-21 DIAGNOSIS — Z85831 Personal history of malignant neoplasm of soft tissue: Secondary | ICD-10-CM

## 2021-06-21 NOTE — Progress Notes
[facility] IN [place] WILL CALL PATIENT AND SCHEDULE SCAN. PATIENT AWARE

## 2021-06-21 NOTE — Progress Notes
ORDERS FAXED TO [facility] [phone]

## 2021-07-11 ENCOUNTER — Encounter: Admit: 2021-07-11 | Discharge: 2021-07-11 | Payer: MEDICARE

## 2021-08-26 ENCOUNTER — Encounter: Admit: 2021-08-26 | Discharge: 2021-08-26 | Payer: MEDICARE

## 2021-09-13 ENCOUNTER — Encounter: Admit: 2021-09-13 | Discharge: 2021-09-13 | Payer: MEDICARE

## 2021-09-13 IMAGING — CT CHEST WO(Adult)
2 of 3 series · 13 of 36 positions shown, 16 images · non-contrast
Comparison: none

[Series 2: thorax ax 1.50 br40 s3 · axial · 0.72mm/px · z∈[+1555,+1855]mm · 10 of 234 slices shown, 13 images]
[im 18/234  mediastinal]
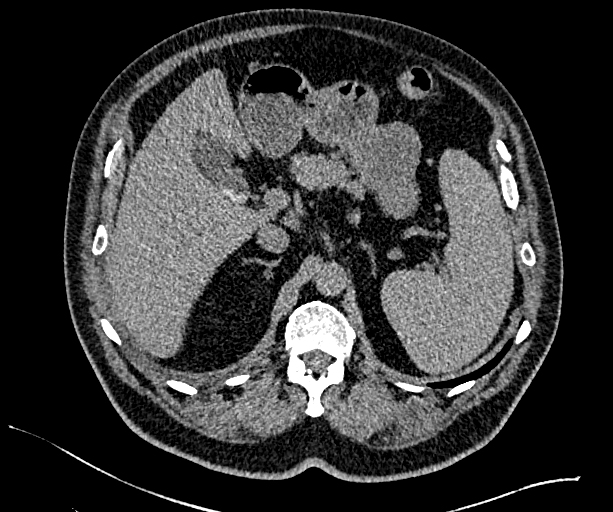
[im 18/234  lung]
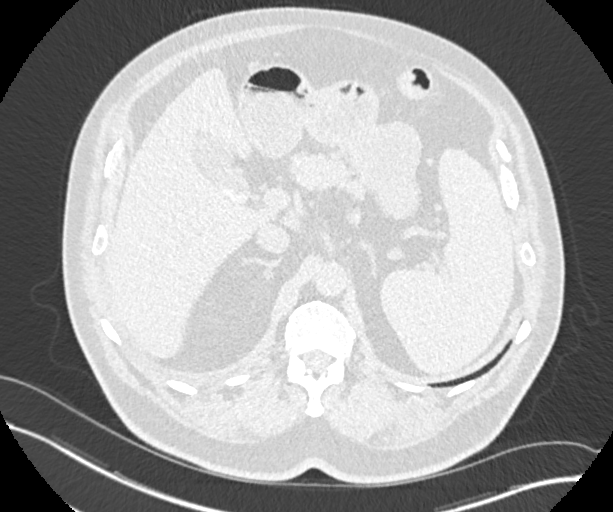
[im 35/234  lung]
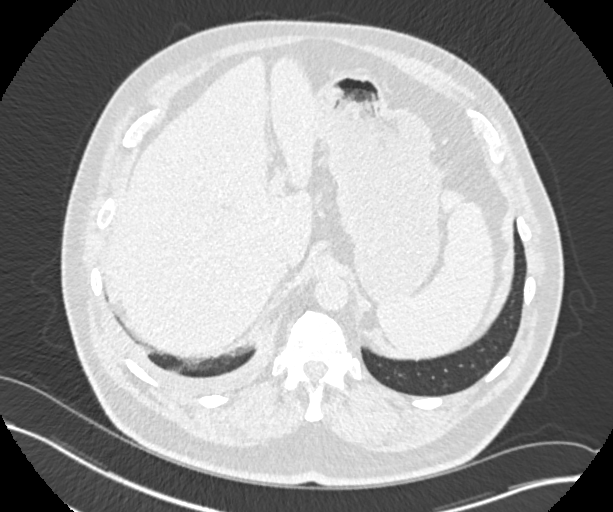
[im 61/234  lung]
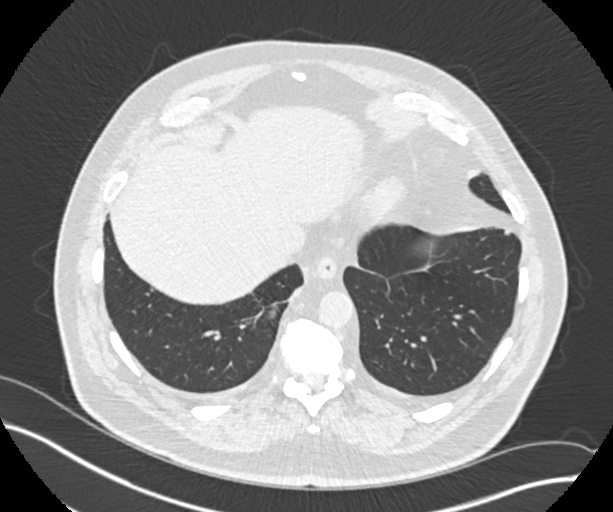
[im 87/234  lung]
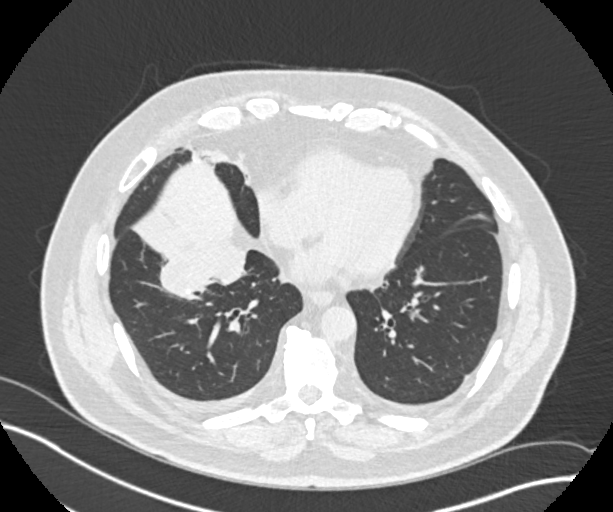
[im 104/234  mediastinal]
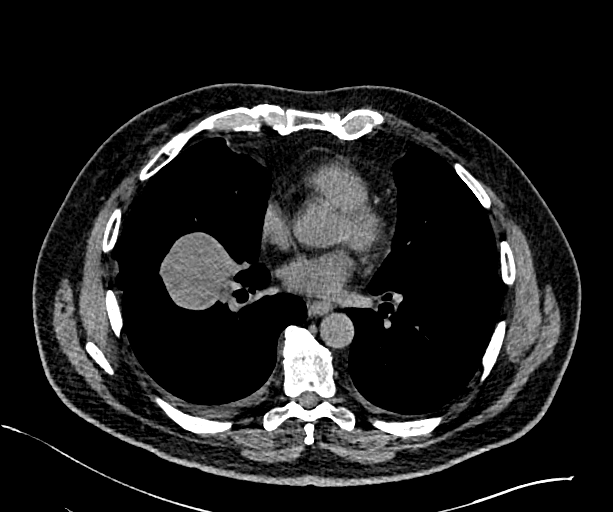
[im 104/234  lung]
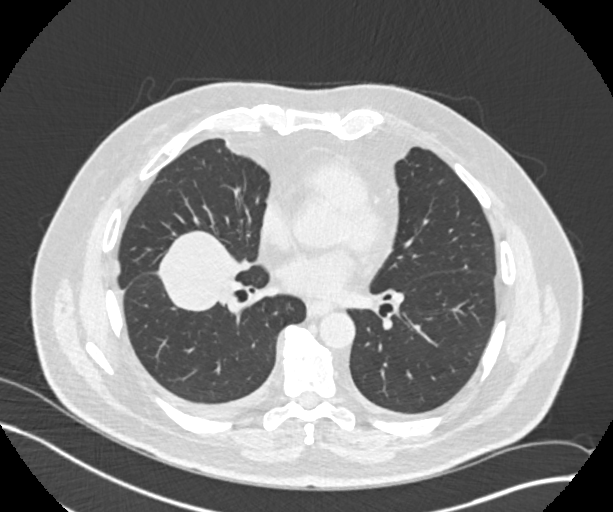
[im 130/234  lung]
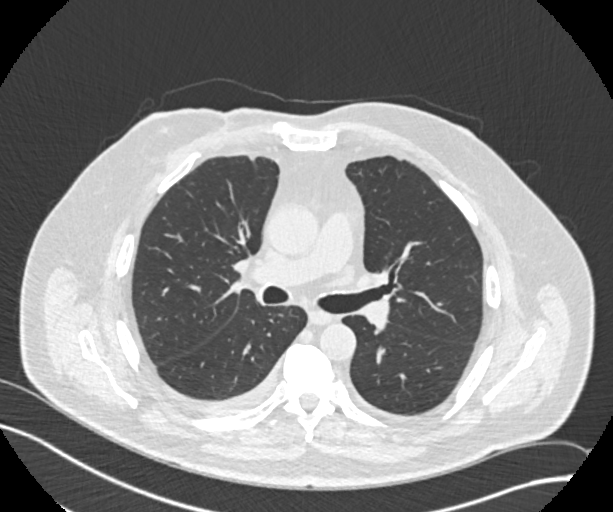
[im 147/234  lung]
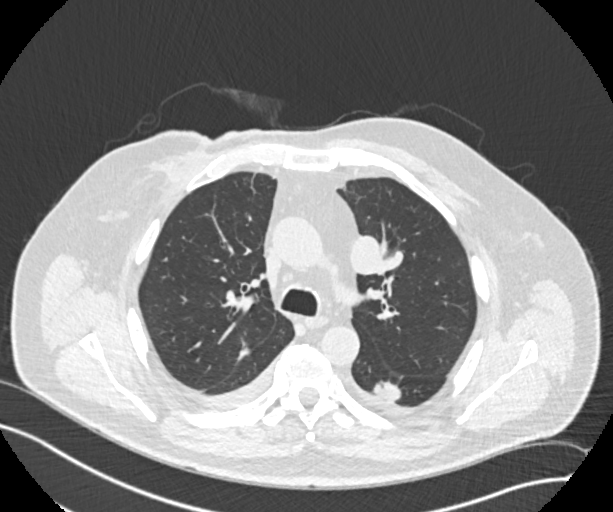
[im 173/234  lung]
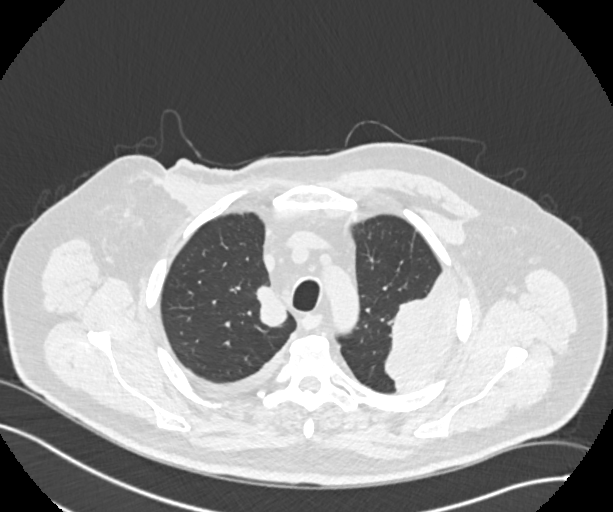
[im 199/234  mediastinal]
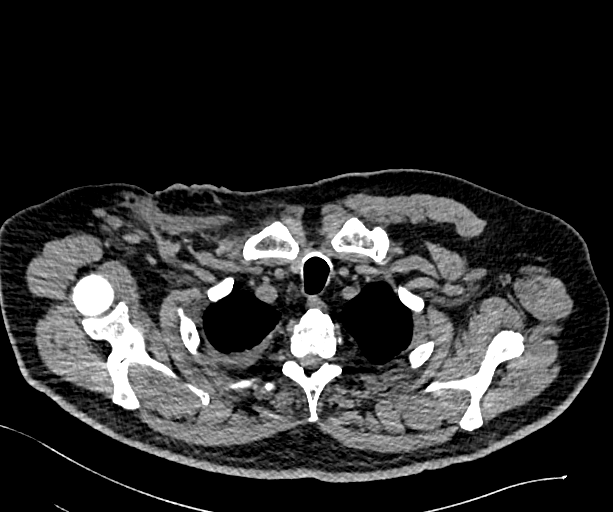
[im 199/234  lung]
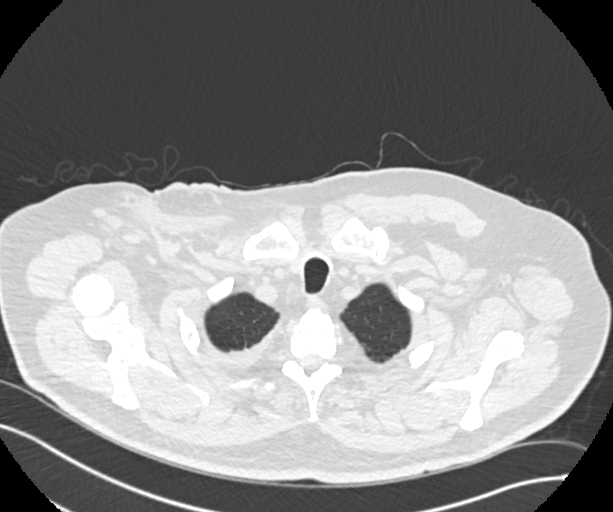
[im 216/234  lung]
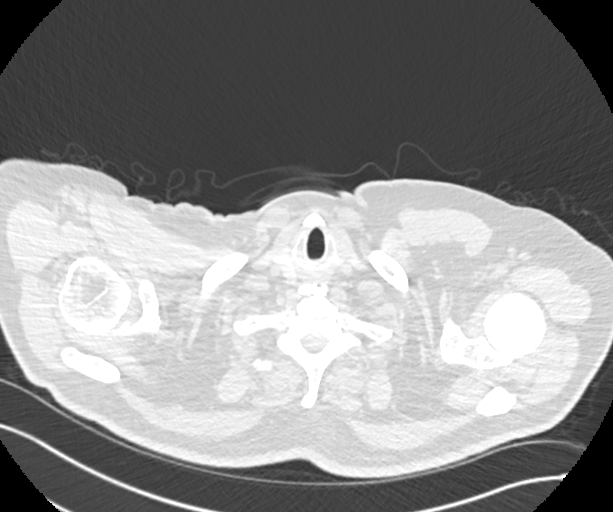

[Series 4: thorax cor 1.50 br40 s3 · coronal · 0.69mm/px · 3 of 243 slices shown]
[im 49/243  lung]
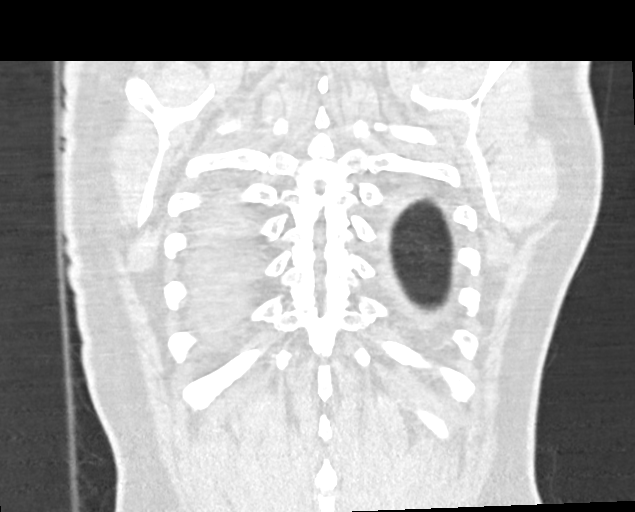
[im 97/243  lung]
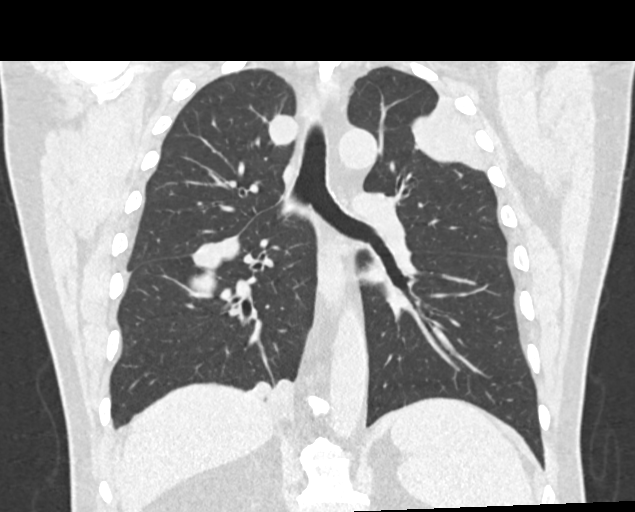
[im 146/243  lung]
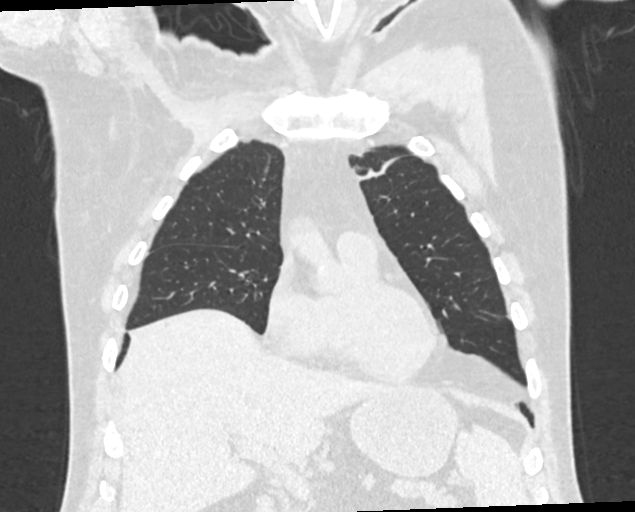

[13 of 36 positions shown; findings below may reference images not displayed]

EXAM

CT CHEST

INDICATION

History of sarcoma

TECHNIQUE

CT of the chest was performed.

All CT scans at this facility use dose modulation, iterative reconstruction, and/or weight based
dosing when appropriate to reduce radiation dose to as low as reasonably achievable.

# of CT scans in the past year: 0 # of Myocardial perfusion scans this past year: 0

COMPARISONS

None available at the time of dictation.

FINDINGS

Lower neck: [Unremarkable]

Lymphadenopathy: [Multiple non-pathologically enlarged mediastinal lymph nodes.]

Cardiovascular: [Normal cardiac size.] [No pericardial effusion.] [Coronary artery calcifications.]
[Normal caliber of the great vessels.]

Upper abdomen: [Cholelithiasis. Hyperdensity at the gallbladder fundus, which may represent a stone
lodged] distally versus underlying mass measuring up to 1.3 cm.

Musculoskeletal: [Diffuse idiopathic skeletal hyperostosis.] [Multilevel degenerative change of the
visualized spine.] Oval-shaped benign-appearing lucency of the posterior T8 vertebral body. No
aggressive osseous lesion. Status post resection of the large lesion along the right anterior chest
with underlying suspected posttreatment change/scarring with removal of the right pectoralis muscle.
Osseous lucency of the right anterior 4th rib measuring up to 0.7 cm (axial 95). Differential for
underlying osseous metastatic lesion versus radiation change.

Lung parenchyma/pleural space: Significant multiple pulmonary metastases. Lesions are predominately
subpleural location. Dominant lesion in the left lung apex measures 9.2 x 4.1 cm in axial dimension
(axial 65). Dominant lesion in the right inferior major fissure measures 6.6 x 6.1 cm (axial 134).
Some lesions demonstrate internal cavitation versus bronchiolectasis. Three lesions in the left
lower lobe. Three digital lesions in the right upper lobe. Three lesions in the left upper lobe. One
dominant lesion in the right lower lobe. Trace right pleural effusion.

Central Airways: Patent

IMPRESSION
1. Significant pulmonary metastases. No staging CT comparison available at the time of dictation.
Correlate with outside CT if available.
2. Focal osseous lucency of the right anterior 4th rib. Indeterminate osseous metastatic disease.
3. Overall imaging appearance suggestive of disease progression.

Tech Notes:

Hx of sarcoma of soft tissue, follow up, looking for progression. CT/NM 0/0 AC

## 2021-09-19 ENCOUNTER — Encounter: Admit: 2021-09-19 | Discharge: 2021-09-19 | Payer: MEDICARE

## 2021-09-19 DIAGNOSIS — C801 Malignant (primary) neoplasm, unspecified: Secondary | ICD-10-CM

## 2021-09-19 DIAGNOSIS — K219 Gastro-esophageal reflux disease without esophagitis: Secondary | ICD-10-CM

## 2021-09-19 DIAGNOSIS — Z85831 Personal history of malignant neoplasm of soft tissue: Secondary | ICD-10-CM

## 2021-09-19 DIAGNOSIS — I10 Essential (primary) hypertension: Secondary | ICD-10-CM

## 2021-09-19 DIAGNOSIS — Z923 Personal history of irradiation: Secondary | ICD-10-CM

## 2021-09-19 NOTE — Progress Notes
Patient Name: Dan Steele  Date of Birth: 06/03/1950  [facility] Medical Record Number:  [id number]          DATE OF SERVICE:  09/19/2021    ATTENDING PHYSICIAN:  Talbert Nan, MD.    ?  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 ?Margins negative.   ?03/12/2021 ?ultrasound-guided drain placement right chest wall ?hematoma   ?  CHEMOTHERAPY:??Dr. Gopichand Pendurti ???none to date (03/19/2021)  ?  RADIATION:?Dr. Yvonna Alanis   right chest wall???????????????????????????5000 cGy????????????????????????10/03/2020 ?through ?11/07/2020  ?      INTERVAL HISTORY: Dan Steele presents in follow up in reference to the above referenced procedure, who is now about 9 months out from that procedure. The patient is progressing along quite nicely at this time, stating that he is performing activities as to his desires. The patient denies any significant functional deficits at this time other than describing some mild (and expected) peri-wound numbness.  He has been utilizing the extremity in a normal fashion and does feel that he is getting back to his pre-operative functional status. He has not palpated any additional or new nodules or masses since last visit.    Medical History:   Diagnosis Date   ? Acid reflux     tums prn    ? 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 History of external beam radiation therapy 12/26/2020   ? Hypertension         Surgical History:   Procedure Laterality Date   ? ROBOTIC LOW ANTERIOR RESECTION, FLEXIBLE SIGMOIDOSCOPY, N/A 04/08/2017    Performed by Benetta Spar, MD at [facility] OR   ? COLONOSCOPY WITH BIOPSY - FLEXIBLE - POSSIBLE POLYPECTOMY N/A 05/04/2018    Performed by [facility]etta Spar[facility] at [facility]   ? Wide en bloc resection RIGHT pectoralis sarcoma Right 12/17/2020    Performed by Talbert Nan, MD at [facility] OR   ? COLONOSCOPY            Current Outpatient Medications:   ?  aspirin EC 81 mg tablet, Take one tablet by mouth daily. Take with food., Disp: , Rfl:   ?  hydroCHLOROthiazide (HYDRODIURIL) 12.5 mg capsule, Take one capsule by mouth daily., Disp: , Rfl:   ?  lisinopril (PRINIVIL, ZESTRIL) 40 mg tablet, Take one tablet by mouth daily., Disp: , Rfl: 1  ?  loperamide (IMODIUM A-D) 2 mg capsule, Take 1mg  before dinner., Disp: 30 capsule, Rfl: 0  ?  Omega-3 Acid Ethyl Esters (LOVAZA) 1 gram capsule, Take two capsules by mouth., Disp: , Rfl:   ?  sodium chloride 0.9 % (flush) 0.9 % syringe, Inject 10 mL to area(s) as directed three times daily., Disp: 900 mL, Rfl: 2  ?  Vit C-Vit E-Lutein-Min-OM-3 150-30-5-150 mg-unit-mg-mg cap, Take 1 capsule by mouth daily., Disp: , Rfl:   ?  vitamins, multiple tablet, Take one tablet by mouth daily., Disp: , Rfl:      REVIEW OF SYSTEMS: No fevers, chills, weight loss or other constitutional disorders. No chest pain, shortness of breath, hemoptysis, bleeding diatheses, or other pulmonary or hematological problems. No adenopathy, unexpected swelling, cardiac, neurologic, or  GI problems.    PHYSICAL EXAMINATION:  Blood pressure 120/73, pulse 86, temperature 36.3 ?C (97.3 ?F), temperature source Temporal, resp. rate 16, height 178 cm (5' 10.08), weight 100.4 kg (221 lb 6.4 oz), SpO2 99 %.  General:  Awake, alert, and oriented x3, in no acute distress, seated in a chair in exam room.  Musculoskeletal:  The wound is well healed. Normal contour and alignment. No masses or nodules are appreciated. No adenopathy or wound problems or complications.  No focal areas of tenderness or unexpected swelling. Motor function is 5/5 in all associated groups, sensation is grossly intact.        ASSESSMENT:  The patient is 9 months s/p wide en bloc resection right pectoralis sarcoma. Overall he continues to do quite well with progression of function and no signs of local recurrence of disease. We discussed that he is doing very well functionally. We discussed that with his UPS lesion, there is still a need for continued surveillance, although after the 2 year mark without any recurrence, there is a lower likelihood of recurrence in the long term. However, we discussed that since he is being followed by Dr. Lorenso Courier with routine CT chest scans, this will also cover the local area of the resection bed, which is all the imaging that he would need.    PLAN:    1. Will continue to monitor by clinical exam and radiographs. I educated the patient as to the proper follow up and monitoring (staging studies) necessary for this diagnosis.  2. We will see him back in 6 months for clinical evaluation.            Talbert Nan, MD   Professor of Orthopedic Surgery  [facility]

## 2021-09-23 ENCOUNTER — Encounter: Admit: 2021-09-23 | Discharge: 2021-09-23 | Payer: MEDICARE

## 2021-09-24 ENCOUNTER — Encounter: Admit: 2021-09-24 | Discharge: 2021-09-24 | Payer: MEDICARE

## 2021-09-24 DIAGNOSIS — Z85831 Personal history of malignant neoplasm of soft tissue: Secondary | ICD-10-CM

## 2021-09-24 DIAGNOSIS — R918 Other nonspecific abnormal finding of lung field: Secondary | ICD-10-CM

## 2021-09-24 DIAGNOSIS — C499 Malignant neoplasm of connective and soft tissue, unspecified: Secondary | ICD-10-CM

## 2021-09-24 NOTE — Progress Notes
Telephone call today:  Problem   Sarcoma (Hcc)    71 y.o. male?presenting in consultation from Dr. Pendurti?and Dr. Harriett Sine reference to a lesion found in the right upper chest wall.     08/03/20 [place] right chest showed a 4.6 x 8.1 cm heterogeneous mildly vascular mass within the right anterior chest, suspicious for a neoplasm  ?  08/08/20 US guided biopsy of right chest mass, pathology revealed a malignant sarcomatoid neoplasm  ?[facility] review of outside biopsy done on 08/08/20:  ?Final Diagnosis:   A. Outside case [id number] (Date Collected: 08/08/2020):   1. Right upper chest wall mass, needle core biopsy: High-grade spindled and pleomorphic sarcoma. See comment.   Comment:   Sections show a malignant spindled and pleomorphic cell sarcoma with about 15 mitotic figures/10 hpf, including atypical mitotic figures. No necrosis or vascular invasion are seen in this biopsy.   The outside immunostains are available for review and show negativity for SOX-10, desmin, pancytokeratin, CAM5.2 (low molecular weight keratin), caldesmon, and CD34. Per outside report, an MDM2 FISH study was negative.   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   ?  09/04/20 CT chest?showed a large heterogeneous likely necrotic mass in the right pectoralis major muscle without definite extra muscular local invasion. There is a solid density nodule at the peripheral aspect of the gallbladder which does not appear to be an intraluminal polyp, though further characterization with ultrasound is recommended. There is evidence of a right hepatic hemangioma. Splenomegaly.?  ?  09/04/20 PET scan showed a large right pectoralis major necrotic mass with avid radiotracer activity compatible with provided history of sarcoma. Focal activity in the left lower neck adjacent to the left thyroid lobe, possibly correlating with a 5 mm hypoechoic thyroid nodule. There is a solid density nodule in the gallbladder fossa likely abutting the outer gallbladder wall without radiotracer activity.  ?  09/25/20 MRI chest showed an extensive heterogeneous neoplasm with internal blood products and cystic necrosis centered within the right pectoralis major muscle. Preservation of the intervening intermuscular fascial plan in between the pectoralis major and minor muscles.    Underwent neoadjuvant radiation followed by definitive resection 12/17/20. Path c/w 17.1cm grade 3 UPS with 90% treatment effect. Unfortunately, CT chest Nov 2022 was reporting new bilateral lung nodules worrisome for metastatic disease. 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 This was sent for NGS - PDL-1 0%, MSS, 5 Muts/Mb (low); RICTOR amplification, ATRX loss exons 2-35, [id number] amplification, RB1 Q685, TP53 C135W. He wished to make the most of his good days then.     He can tell things are changing as he is having more discomfort and pain. F/u CT scan May 19th 2023 is reporting ongoing growth in multiple large lung nodules (>9cm LUL nodule is largest now). IN sticking with previous conversations, he is not interested in palliative systemic therapy, and wants to star hospice care there in [place] through [facility] agency.     Medical History:   Diagnosis Date   ? Acid reflux     tums prn    ? 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 History of external beam radiation therapy 12/26/2020   ? Hypertension      Past surg Hx: 04/08/17 robotic LAR with Dr. 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 Reviewed epidemiology, risk factors, staging (iv) and prognosis, then subsequent palliative treatment strategies. Everything we are talking about from here on out is incurable, with treatment being palliative in nature. Considered conventional chemo with doxorubicin-based regimens, then if or when he does not tolerate or it stops working, consider second line agents like TKIs (votrient) or other chemo regimens. Immunotherapy could be tried after failing conventional options, as [id number] response to pembrolizumab was not correlated to PDL-1 expression, but I am skeptical immunotherapeutics would work in his case, as none of the markers suggest real efficacy (low TMB, MSS, PDL-1 0%.). Due to personal preference, will get hospice started asap. No need for future labs or imaging.  Discussed with the patient and all questions fully answered. He will call me if any problems arise.

## 2021-09-24 NOTE — Telephone Encounter
Referral faxed to [facility] [phone] receipt confirmed at [phone]

## 2021-09-25 ENCOUNTER — Encounter: Admit: 2021-09-25 | Discharge: 2021-09-25 | Payer: MEDICARE

## 2021-09-25 NOTE — Telephone Encounter
Don RN with [facility] hospice calling to inform that he will be admitting patient to Hospice service this afternoon

## 2021-11-26 DEATH — deceased

## 2021-12-24 ENCOUNTER — Encounter: Admit: 2021-12-24 | Discharge: 2021-12-24 | Payer: MEDICARE

## 2022-01-20 ENCOUNTER — Encounter: Admit: 2022-01-20 | Discharge: 2022-01-20 | Payer: MEDICARE

## 2022-01-20 NOTE — Progress Notes
POST-MORTEM DOCUMENTATION    Name: Dan Steele "[name]   MRN: [id number]     DOB: 06-08-1950      Age: 71 y.o.  Admission Date: (Not on file)     LOS: 0 days     Date of Service: 18-Feb-2022      Date of death if known:  2021/12/13  Location of death, if known:Other  How were you notified?  [facility] Nestle  Who notified us of death?     Was hospice involved? Unknown  Name of hospice involved, if known:   Date of hospice admission, if known:     Other information:

## 2022-08-01 ENCOUNTER — Encounter: Admit: 2022-08-01 | Discharge: 2022-08-01 | Payer: MEDICARE
